# Patient Record
Sex: Male | Born: 1985 | Race: White | Hispanic: No | Smoking: Former smoker
Health system: Southern US, Community
[De-identification: ages and names within clinical notes are randomized; demographics above are authoritative.]

## PROBLEM LIST (undated history)

## (undated) DIAGNOSIS — G4733 Obstructive sleep apnea (adult) (pediatric): Secondary | ICD-10-CM

## (undated) DIAGNOSIS — N189 Chronic kidney disease, unspecified: Secondary | ICD-10-CM

## (undated) DIAGNOSIS — M51369 Other intervertebral disc degeneration, lumbar region without mention of lumbar back pain or lower extremity pain: Secondary | ICD-10-CM

## (undated) DIAGNOSIS — R519 Headache, unspecified: Secondary | ICD-10-CM

## (undated) DIAGNOSIS — K219 Gastro-esophageal reflux disease without esophagitis: Secondary | ICD-10-CM

## (undated) DIAGNOSIS — G8929 Other chronic pain: Secondary | ICD-10-CM

## (undated) DIAGNOSIS — J45909 Unspecified asthma, uncomplicated: Secondary | ICD-10-CM

## (undated) DIAGNOSIS — M722 Plantar fascial fibromatosis: Secondary | ICD-10-CM

## (undated) DIAGNOSIS — E559 Vitamin D deficiency, unspecified: Secondary | ICD-10-CM

## (undated) DIAGNOSIS — K769 Liver disease, unspecified: Secondary | ICD-10-CM

## (undated) DIAGNOSIS — M5136 Other intervertebral disc degeneration, lumbar region: Secondary | ICD-10-CM

## (undated) HISTORY — DX: Obstructive sleep apnea (adult) (pediatric): G47.33

## (undated) HISTORY — DX: Other intervertebral disc degeneration, lumbar region without mention of lumbar back pain or lower extremity pain: M51.369

## (undated) HISTORY — DX: Other chronic pain: G89.29

## (undated) HISTORY — DX: Chronic kidney disease, unspecified: N18.9

## (undated) HISTORY — DX: Plantar fascial fibromatosis: M72.2

## (undated) HISTORY — DX: Other intervertebral disc degeneration, lumbar region: M51.36

## (undated) HISTORY — DX: Headache, unspecified: R51.9

## (undated) HISTORY — DX: Gastro-esophageal reflux disease without esophagitis: K21.9

## (undated) HISTORY — DX: Vitamin D deficiency, unspecified: E55.9

## (undated) HISTORY — PX: NO PAST SURGERIES: SHX2092

## (undated) HISTORY — DX: Liver disease, unspecified: K76.9

---

## 2016-05-10 ENCOUNTER — Encounter (HOSPITAL_COMMUNITY): Payer: Self-pay | Admitting: Emergency Medicine

## 2016-05-10 ENCOUNTER — Emergency Department (HOSPITAL_COMMUNITY)
Admission: EM | Admit: 2016-05-10 | Discharge: 2016-05-10 | Disposition: A | Discharge status: Home / Self Care | Payer: Non-veteran care | Attending: Emergency Medicine | Admitting: Emergency Medicine

## 2016-05-10 ENCOUNTER — Emergency Department (HOSPITAL_COMMUNITY): Payer: Non-veteran care

## 2016-05-10 DIAGNOSIS — R05 Cough: Secondary | ICD-10-CM | POA: Diagnosis present

## 2016-05-10 DIAGNOSIS — R6889 Other general symptoms and signs: Secondary | ICD-10-CM

## 2016-05-10 DIAGNOSIS — J111 Influenza due to unidentified influenza virus with other respiratory manifestations: Secondary | ICD-10-CM | POA: Insufficient documentation

## 2016-05-10 LAB — CBC
HCT: 42.1 % (ref 39.0–52.0)
Hemoglobin: 14.5 g/dL (ref 13.0–17.0)
MCH: 31.4 pg (ref 26.0–34.0)
MCHC: 34.4 g/dL (ref 30.0–36.0)
MCV: 91.1 fL (ref 78.0–100.0)
PLATELETS: 140 10*3/uL — AB (ref 150–400)
RBC: 4.62 MIL/uL (ref 4.22–5.81)
RDW: 12.7 % (ref 11.5–15.5)
WBC: 6.5 10*3/uL (ref 4.0–10.5)

## 2016-05-10 LAB — I-STAT TROPONIN, ED: Troponin i, poc: 0 ng/mL (ref 0.00–0.08)

## 2016-05-10 LAB — BASIC METABOLIC PANEL
Anion gap: 9 (ref 5–15)
BUN: 9 mg/dL (ref 6–20)
CALCIUM: 8.7 mg/dL — AB (ref 8.9–10.3)
CO2: 28 mmol/L (ref 22–32)
CREATININE: 1.2 mg/dL (ref 0.61–1.24)
Chloride: 103 mmol/L (ref 101–111)
GFR calc Af Amer: >60 mL/min (ref >60)
Glucose, Bld: 121 mg/dL — ABNORMAL HIGH (ref 65–99)
Potassium: 4.2 mmol/L (ref 3.5–5.1)
SODIUM: 140 mmol/L (ref 135–145)

## 2016-05-10 MED ORDER — NAPROXEN 500 MG PO TABS: 500.0000 mg | ORAL_TABLET | Freq: Two times a day (BID) | ORAL | 0 refills | Status: DC

## 2016-05-10 MED ORDER — ACETAMINOPHEN 325 MG PO TABS
650.0000 mg | ORAL_TABLET | Freq: Once | ORAL | Status: AC
  Administered 2016-05-10: 650 mg via ORAL
  Filled 2016-05-10: qty 2

## 2016-05-10 MED ORDER — SODIUM CHLORIDE 0.9 % IV BOLUS (SEPSIS)
1000.0000 mL | Freq: Once | INTRAVENOUS | Status: AC
  Administered 2016-05-10: 1000 mL via INTRAVENOUS

## 2016-05-10 MED ORDER — SODIUM CHLORIDE 0.9 % IV SOLN: INTRAVENOUS | Status: DC

## 2016-05-10 MED ORDER — DM-GUAIFENESIN ER 30-600 MG PO TB12: 1.0000 | ORAL_TABLET | Freq: Two times a day (BID) | ORAL | 1 refills | Status: DC

## 2016-05-10 MED ORDER — SODIUM CHLORIDE 0.9 % IV BOLUS (SEPSIS)
1000.0000 mL | Freq: Once | INTRAVENOUS | Status: AC
Start: 2016-05-10 — End: 2016-05-10
  Administered 2016-05-10: 1000 mL via INTRAVENOUS

## 2016-05-10 NOTE — ED Triage Notes (Signed)
Pt reports cough with clear phlegm, fevers and chest pain since Friday. States son had pneumonia last week.

## 2016-05-10 NOTE — ED Notes (Signed)
Pt states he understands instructions. All questions answered home stable with steady gait.

## 2016-05-10 NOTE — Discharge Instructions (Signed)
Rest increase fluid intake. Work note provided. Take the Naprosyn as needed for headaches body ache or any fevers. Taking Mucinex DM for the cough. Return for any new or worse symptoms.

## 2016-05-10 NOTE — ED Provider Notes (Signed)
[facility]-EMERGENCY DEPT Provider Note   CSN: [id number] Arrival date & time: 05/10/16  [id number]     History   Chief Complaint Chief Complaint  Patient presents with  . Chest Pain  . Cough  . URI    HPI Michael Carter is a 30 y.o. male.  Patient with onset of cough productive fevers bodyaches headache some shortness of breath. Since Friday. Patient has a son at home with a diagnosis of pneumonia. No nausea vomiting or diarrhea.      History reviewed. No pertinent past medical history.  There are no active problems to display for this patient.   History reviewed. No pertinent surgical history.     Home Medications    Prior to Admission medications   Medication Sig Start Date End Date Taking? Authorizing Provider  acetaminophen (TYLENOL) 325 MG tablet Take 650 mg by mouth every 6 (six) hours as needed for fever.   Yes Historical Provider, MD  flunisolide (NASALIDE) 25 MCG/ACT (0.025%) SOLN Place 2 sprays into the nose daily as needed (allergies).   Yes Historical Provider, MD  loratadine (CLARITIN) 10 MG tablet Take 10 mg by mouth daily as needed for allergies.   Yes Historical Provider, MD  triprolidine-pseudoephedrine (APRODINE) 2.5-60 MG TABS tablet Take 1 tablet by mouth every 6 (six) hours as needed for allergies.   Yes Historical Provider, MD  dextromethorphan-guaiFENesin (MUCINEX DM) 30-600 MG 12hr tablet Take 1 tablet by mouth 2 (two) times daily. 05/10/16   Vanetta MuldersScott Eleah Lahaie, MD  naproxen (NAPROSYN) 500 MG tablet Take 1 tablet (500 mg total) by mouth 2 (two) times daily. 05/10/16   Vanetta MuldersScott Danyal Whitenack, MD    Family History No family history on file.  Social History Social History  Substance Use Topics  . Smoking status: Never Smoker  . Smokeless tobacco: Never Used  . Alcohol use No     Allergies   Percocet [oxycodone-acetaminophen]   Review of Systems Review of Systems  Constitutional: Positive for fever.  HENT: Positive for congestion. Negative for sore  throat.   Eyes: Negative for redness.  Respiratory: Positive for cough and shortness of breath.   Cardiovascular: Negative for chest pain.  Gastrointestinal: Negative for abdominal pain.  Musculoskeletal: Positive for myalgias.  Skin: Negative for rash.  Neurological: Positive for headaches.  Hematological: Does not bruise/bleed easily.  Psychiatric/Behavioral: Negative for confusion.     Physical Exam Updated Vital Signs BP 120/57   Pulse 88   Temp 100 F (37.8 C) (Oral)   Resp (!) 27   SpO2 96%   Physical Exam  Constitutional: He is oriented to person, place, and time. He appears well-developed and well-nourished. No distress.  HENT:  Head: Normocephalic and atraumatic.  Mucous membranes dry.  Eyes: EOM are normal. Pupils are equal, round, and reactive to light.  Neck: Normal range of motion. Neck supple.  Cardiovascular: Normal rate, regular rhythm and normal heart sounds.   No murmur heard. Pulmonary/Chest: Effort normal and breath sounds normal. No respiratory distress. He has no wheezes. He has no rales.  Abdominal: Soft. Bowel sounds are normal. There is no tenderness.  Musculoskeletal: Normal range of motion. He exhibits no edema.  Neurological: He is alert and oriented to person, place, and time. No cranial nerve deficit or sensory deficit. He exhibits normal muscle tone. Coordination normal.  Skin: Skin is warm. Capillary refill takes less than 2 seconds.  Nursing note and vitals reviewed.    ED Treatments / Results  Labs (all labs ordered are listed,  but only abnormal results are displayed) Labs Reviewed  BASIC METABOLIC PANEL - Abnormal; Notable for the following:       Result Value   Glucose, Bld 121 (*)    Calcium 8.7 (*)    All other components within normal limits  CBC - Abnormal; Notable for the following:    Platelets 140 (*)    All other components within normal limits  I-STAT TROPOININ, ED    EKG  EKG Interpretation  Date/Time:  Sunday  May 10 2016 07:00:03 EST Ventricular Rate:  113 PR Interval:    QRS Duration: 72 QT Interval:  303 QTC Calculation: 416 R Axis:   4 Text Interpretation:  Sinus tachycardia Probable anteroseptal infarct, old No prior for comparison No previous ECGs available Reconfirmed by Thandiwe Siragusa  MD, Latroya Ng [phone]([id number]) on 05/10/2016 7:27:31 AM Also confirmed by Deretha EmoryZACKOWSKI  MD, Ezrie Bunyan [phone]([id number]), editor [place]  CCT, BEVERLY ([id number])  on 05/10/2016 7:48:17 AM       Radiology Dg Chest 2 View  Result Date: 05/10/2016 CLINICAL DATA:  Patient with productive cough.  Fever and chills. EXAM: CHEST  2 VIEW COMPARISON:  None. FINDINGS: The heart size and mediastinal contours are within normal limits. Both lungs are clear. The visualized skeletal structures are unremarkable. IMPRESSION: No active cardiopulmonary disease. Electronically Signed   By: Annia Beltrew  Davis M.D.   On: 05/10/2016 07:56    Procedures Procedures (including critical care time)  Medications Ordered in ED Medications  0.9 %  sodium chloride infusion ( Intravenous Not Given 05/10/16 1128)  sodium chloride 0.9 % bolus 1,000 mL (0 mLs Intravenous Stopped 05/10/16 0951)  sodium chloride 0.9 % bolus 1,000 mL (0 mLs Intravenous Stopped 05/10/16 1053)  sodium chloride 0.9 % bolus 1,000 mL (0 mLs Intravenous Stopped 05/10/16 1250)     Initial Impression / Assessment and Plan / ED Course  I have reviewed the triage vital signs and the nursing notes.  Pertinent labs & imaging results that were available during my care of the patient were reviewed by me and considered in my medical decision making (see chart for details).  Clinical Course     Symptoms consistent with flulike illness. Chest x-ray negative for pneumonia. Patient with significant dehydration. Hydrated with 3 L of fluid before he started to urinate. Patient feels better overall. Will be treated symptomatically for cough and congestion and body aches and fever. Patient will return for any  new or worse symptoms. Work note provided.  Final Clinical Impressions(s) / ED Diagnoses   Final diagnoses:  Flu-like symptoms    New Prescriptions New Prescriptions   DEXTROMETHORPHAN-GUAIFENESIN (MUCINEX DM) 30-600 MG 12HR TABLET    Take 1 tablet by mouth 2 (two) times daily.   NAPROXEN (NAPROSYN) 500 MG TABLET    Take 1 tablet (500 mg total) by mouth 2 (two) times daily.     Vanetta MuldersScott Camdyn Laden, MD 05/10/16 [phone]

## 2017-03-21 ENCOUNTER — Emergency Department (HOSPITAL_COMMUNITY): Payer: BC Managed Care – PPO

## 2017-03-21 ENCOUNTER — Encounter (HOSPITAL_COMMUNITY): Payer: Self-pay | Admitting: Emergency Medicine

## 2017-03-21 ENCOUNTER — Emergency Department (HOSPITAL_COMMUNITY)
Admission: EM | Admit: 2017-03-21 | Discharge: 2017-03-21 | Disposition: A | Discharge status: Home / Self Care | Payer: BC Managed Care – PPO | Attending: Emergency Medicine | Admitting: Emergency Medicine

## 2017-03-21 DIAGNOSIS — R509 Fever, unspecified: Secondary | ICD-10-CM | POA: Diagnosis present

## 2017-03-21 DIAGNOSIS — B349 Viral infection, unspecified: Secondary | ICD-10-CM | POA: Diagnosis not present

## 2017-03-21 DIAGNOSIS — R51 Headache: Secondary | ICD-10-CM | POA: Insufficient documentation

## 2017-03-21 DIAGNOSIS — R519 Headache, unspecified: Secondary | ICD-10-CM

## 2017-03-21 DIAGNOSIS — R197 Diarrhea, unspecified: Secondary | ICD-10-CM

## 2017-03-21 DIAGNOSIS — R112 Nausea with vomiting, unspecified: Secondary | ICD-10-CM

## 2017-03-21 HISTORY — DX: Unspecified asthma, uncomplicated: J45.909

## 2017-03-21 LAB — MONONUCLEOSIS SCREEN: MONO SCREEN: NEGATIVE

## 2017-03-21 LAB — URINALYSIS, ROUTINE W REFLEX MICROSCOPIC
Bacteria, UA: NONE SEEN
Bilirubin Urine: NEGATIVE
Glucose, UA: NEGATIVE mg/dL
HGB URINE DIPSTICK: NEGATIVE
Ketones, ur: 20 mg/dL — AB
Leukocytes, UA: NEGATIVE
Nitrite: NEGATIVE
Protein, ur: 30 mg/dL — AB
SPECIFIC GRAVITY, URINE: 1.036 — AB (ref 1.005–1.030)
pH: 5 (ref 5.0–8.0)

## 2017-03-21 LAB — CBC
HEMATOCRIT: 43.1 % (ref 39.0–52.0)
HEMOGLOBIN: 14.7 g/dL (ref 13.0–17.0)
MCH: 31 pg (ref 26.0–34.0)
MCHC: 34.1 g/dL (ref 30.0–36.0)
MCV: 90.9 fL (ref 78.0–100.0)
Platelets: 130 10*3/uL — ABNORMAL LOW (ref 150–400)
RBC: 4.74 MIL/uL (ref 4.22–5.81)
RDW: 12.6 % (ref 11.5–15.5)
WBC: 10.8 10*3/uL — ABNORMAL HIGH (ref 4.0–10.5)

## 2017-03-21 LAB — INFLUENZA PANEL BY PCR (TYPE A & B)
Influenza A By PCR: NEGATIVE
Influenza B By PCR: NEGATIVE

## 2017-03-21 LAB — COMPREHENSIVE METABOLIC PANEL
ALBUMIN: 4 g/dL (ref 3.5–5.0)
ALK PHOS: 37 U/L — AB (ref 38–126)
ALT: 35 U/L (ref 17–63)
ANION GAP: 9 (ref 5–15)
AST: 26 U/L (ref 15–41)
BUN: 13 mg/dL (ref 6–20)
CALCIUM: 8.7 mg/dL — AB (ref 8.9–10.3)
CHLORIDE: 105 mmol/L (ref 101–111)
CO2: 23 mmol/L (ref 22–32)
Creatinine, Ser: 1.26 mg/dL — ABNORMAL HIGH (ref 0.61–1.24)
GFR calc Af Amer: >60 mL/min (ref >60)
GFR calc non Af Amer: >60 mL/min (ref >60)
GLUCOSE: 107 mg/dL — AB (ref 65–99)
POTASSIUM: 4.1 mmol/L (ref 3.5–5.1)
SODIUM: 137 mmol/L (ref 135–145)
Total Bilirubin: 1.2 mg/dL (ref 0.3–1.2)
Total Protein: 6.8 g/dL (ref 6.5–8.1)

## 2017-03-21 LAB — LIPASE, BLOOD: LIPASE: 25 U/L (ref 11–51)

## 2017-03-21 LAB — I-STAT CG4 LACTIC ACID, ED
LACTIC ACID, VENOUS: 2.56 mmol/L — AB (ref 0.5–1.9)
Lactic Acid, Venous: 0.58 mmol/L (ref 0.5–1.9)

## 2017-03-21 LAB — RAPID STREP SCREEN (MED CTR MEBANE ONLY): STREPTOCOCCUS, GROUP A SCREEN (DIRECT): NEGATIVE

## 2017-03-21 MED ORDER — ACETAMINOPHEN 325 MG PO TABS
650.0000 mg | ORAL_TABLET | Freq: Once | ORAL | Status: AC
  Administered 2017-03-21: 650 mg via ORAL
  Filled 2017-03-21: qty 2

## 2017-03-21 MED ORDER — ONDANSETRON HCL 4 MG/2ML IJ SOLN
4.0000 mg | Freq: Once | INTRAMUSCULAR | Status: AC
  Administered 2017-03-21: 4 mg via INTRAVENOUS
  Filled 2017-03-21: qty 2

## 2017-03-21 MED ORDER — IBUPROFEN 400 MG PO TABS
600.0000 mg | ORAL_TABLET | Freq: Once | ORAL | Status: AC
  Administered 2017-03-21: 18:00:00 600 mg via ORAL
  Filled 2017-03-21: qty 1

## 2017-03-21 MED ORDER — KETOROLAC TROMETHAMINE 30 MG/ML IJ SOLN
30.0000 mg | Freq: Once | INTRAMUSCULAR | Status: AC
  Administered 2017-03-21: 30 mg via INTRAVENOUS
  Filled 2017-03-21: qty 1

## 2017-03-21 MED ORDER — ONDANSETRON 8 MG PO TBDP: 8.0000 mg | ORAL_TABLET | Freq: Three times a day (TID) | ORAL | 0 refills | Status: DC | PRN

## 2017-03-21 MED ORDER — SODIUM CHLORIDE 0.9 % IV BOLUS (SEPSIS)
1000.0000 mL | Freq: Once | INTRAVENOUS | Status: AC
  Administered 2017-03-21: 1000 mL via INTRAVENOUS

## 2017-03-21 MED ORDER — IOPAMIDOL (ISOVUE-300) INJECTION 61%
INTRAVENOUS | Status: AC
  Administered 2017-03-21: 100 mL
  Filled 2017-03-21: qty 100

## 2017-03-21 NOTE — ED Triage Notes (Signed)
Pt reports sore throat, fever TMAX 103.3, headache, stomach pain and diarrhea x3 days. Tylenol effective for fever, last dose 0330.

## 2017-03-21 NOTE — ED Provider Notes (Signed)
5:28 PM Patient signed out to me at shift change. Patient to the emergency department complaining of generalized body aches, headache, nausea, vomiting, diarrhea. Patient's blood work initially showed slightly elevated lactic acid of 2.56. His temperature was 102. He was treated with Toradol, Tylenol, IV fluids. His white blood cell count was okay at 10.8. His creatinine was slightly elevated at 1.26, otherwise normal electrolytes. His influenza panel was negative, strep screen, mononucleosis screen were all negative. CT scan abdomen and pelvis was obtained which was normal as well other than maybe mild cholelithiasis. I reassessed the patient at this time. He still complaining of a headache and sore throat. His temperature which I checked myself is normal at 98.7. His vital signs are normal. I examined his abdomen, it is nontender. Specifically no right upper quadrant tenderness. He has no nuchal rigidity. Lungs are clear. He has no photophobia. Had do not think this patient has meningitis, I do not think he has cholecystitis. His repeat lactic acid came back normal at 0.58. He is asking for something else for the headache, will give him ibuprofen. He is also asking for something for nausea at home. I discharge him I believe patient is stable for discharge home, most likely viral gastroenteritis given multiple episodes of nausea, vomiting, diarrhea at home and fever. He has not had an episode of diarrhea here. He is comfortable going home. We discussed return precautions, also discussed following up with family doctor. He agreed and voiced understanding of all discharge instructions.  Vitals:   03/21/17 1330 03/21/17 1415 03/21/17 1500 03/21/17 1545  BP: 108/63 114/67 108/72 (!) 105/58  Pulse: 92 95 87 88  Resp: Temp:   98.8 F (37.1 C)   TempSrc:   Oral   SpO2: 97% 98% 95% 95%  Weight:      Height:          Jaynie Crumble, PA-C 03/21/17 1743    Shaune Pollack, MD 03/21/17  1935

## 2017-03-21 NOTE — ED Provider Notes (Signed)
[facility]Y DEPT Provider Note   CSN: [id number] Arrival date & time: 03/21/17  4098     History   Chief Complaint Chief Complaint  Patient presents with  . Fever  . Abdominal Pain    HPI   Blood pressure 119/75, pulse 83, temperature 98.6 F (37 C), temperature source Oral, resp. rate 18, height  (1.702 m), weight 122.5 kg (270 lb), SpO2 95 %.  Michael Carter is a 31 y.o. male with past medical history significant for nonalcoholic fatty liver disease complaining of fever for 3 days (Tmax 103.3 this a.m.), appropriately defervesces this with Tylenol, sore throat,several episodes of nonbloody, nonbilious, no coffee-ground emesis this morning with associated dry cough, headache, myalgia including abdominal pain and posterior thoracic pain with neck pain. 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 He denies any tick bites, rashes.    History reviewed. No pertinent past medical history.  There are no active problems to display for this patient.   History reviewed. No pertinent surgical history.     Home Medications    Prior to Admission medications   Medication Sig Start Date End Date Taking? Authorizing Provider  acetaminophen (TYLENOL) 500 MG tablet Take 1,000 mg by mouth every 6 (six) hours as needed for moderate pain, fever or headache.    Yes [provider]  cetirizine (ZYRTEC) 10 MG tablet Take 10 mg by mouth daily as needed for allergies.   Yes [provider]  fluticasone (FLONASE) 50 MCG/ACT nasal spray Place 2 sprays into both nostrils daily.   Yes [provider]  dextromethorphan-guaiFENesin (MUCINEX DM) 30-600 MG 12hr tablet Take 1 tablet by mouth 2 (two) times daily. Patient not taking: Reported on 03/21/2017 05/10/16   Vanetta Mulders, MD  naproxen (NAPROSYN) 500 MG tablet Take 1 tablet (500 mg total) by mouth 2 (two) times daily. Patient not taking: Reported on 03/21/2017  05/10/16   Vanetta Mulders, MD    Family History No family history on file.  Social History Social History  Substance Use Topics  . Smoking status: Never Smoker  . Smokeless tobacco: Never Used  . Alcohol use No     Allergies   Percocet [oxycodone-acetaminophen]   Review of Systems Review of Systems  A complete review of systems was obtained and all systems are negative except as noted in the HPI and PMH.    Physical Exam Updated Vital Signs BP (!) 105/58   Pulse 88   Temp 98.8 F (37.1 C) (Oral)   Resp 20   Ht  (1.702 m)   Wt 122.5 kg (270 lb)   SpO2 95%   BMI 42.29 kg/m   Physical Exam  Constitutional: He appears well-developed and well-nourished.  HENT:  Head: Normocephalic.  Right Ear: External ear normal.  Left Ear: External ear normal.  Mouth/Throat: Oropharynx is clear and moist. No oropharyngeal exudate.  No drooling or stridor. Posterior pharynx mildly erythematous no significant tonsillar hypertrophy. No exudate. Soft palate rises symmetrically. No TTP or induration under tongue.   No tenderness to palpation of frontal or bilateral maxillary sinuses.  Mild mucosal edema in the nares with scant rhinorrhea.  Bilateral tympanic membranes with normal architecture and good light reflex.    Eyes: Pupils are equal, round, and reactive to light. Conjunctivae and EOM are normal.  Neck: Normal range of motion. Neck supple.  FROM to C-spine. Pt can touch chin to chest without discomfort. No TTP of  midline cervical spine.   Cardiovascular: Normal rate and regular rhythm.   Pulmonary/Chest: Effort normal and breath sounds normal. No stridor. No respiratory distress. He has no wheezes. He has no rales. He exhibits no tenderness.  Abdominal: Soft. There is no tenderness. There is no rebound and no guarding.  Neurological: Abnormal muscle tone: .npmdm.  Nursing note and vitals reviewed.    ED Treatments / Results  Labs (all labs ordered are listed,  but only abnormal results are displayed) Labs Reviewed  COMPREHENSIVE METABOLIC PANEL - Abnormal; Notable for the following:       Result Value   Glucose, Bld 107 (*)    Creatinine, Ser 1.26 (*)    Calcium 8.7 (*)    Alkaline Phosphatase 37 (*)    All other components within normal limits  CBC - Abnormal; Notable for the following:    WBC 10.8 (*)    Platelets 130 (*)    All other components within normal limits  URINALYSIS, ROUTINE W REFLEX MICROSCOPIC - Abnormal; Notable for the following:    Specific Gravity, Urine 1.036 (*)    Ketones, ur 20 (*)    Protein, ur 30 (*)    Squamous Epithelial / LPF 0-5 (*)    All other components within normal limits  I-STAT CG4 LACTIC ACID, ED - Abnormal; Notable for the following:    Lactic Acid, Venous 2.56 (*)    All other components within normal limits  RAPID STREP SCREEN (NOT AT [facility])  CULTURE, GROUP A STREP ([facility])  CULTURE, BLOOD (ROUTINE X 2)  CULTURE, BLOOD (ROUTINE X 2)  LIPASE, BLOOD  INFLUENZA PANEL BY PCR (TYPE A & B)  MONONUCLEOSIS SCREEN  I-STAT CG4 LACTIC ACID, ED    EKG  EKG Interpretation None       Radiology Dg Chest 2 View  Result Date: 03/21/2017 CLINICAL DATA:  Migraines.  Cough and fever. EXAM: CHEST  2 VIEW COMPARISON:  May 10, 2016 FINDINGS: The heart size and mediastinal contours are within normal limits. Both lungs are clear. The visualized skeletal structures are unremarkable. IMPRESSION: No active cardiopulmonary disease. Electronically Signed   By: Gerome Sam III M.D   On: 03/21/2017 11:51    Procedures Procedures (including critical care time)  Medications Ordered in ED Medications  sodium chloride 0.9 % bolus 1,000 mL (0 mLs Intravenous Stopped 03/21/17 1517)  ondansetron (ZOFRAN) injection 4 mg (4 mg Intravenous Given 03/21/17 1233)  ketorolac (TORADOL) 30 MG/ML injection 30 mg (30 mg Intravenous Given 03/21/17 1234)  sodium chloride 0.9 % bolus 1,000 mL (0 mLs Intravenous Stopped 03/21/17  1517)  acetaminophen (TYLENOL) tablet 650 mg (650 mg Oral Given 03/21/17 1355)     Initial Impression / Assessment and Plan / ED Course  I have reviewed the triage vital signs and the nursing notes.  Pertinent labs & imaging results that were available during my care of the patient were reviewed by me and considered in my medical decision making (see chart for details).     Vitals:   03/21/17 1330 03/21/17 1415 03/21/17 1500 03/21/17 1545  BP: 108/63 114/67 108/72 (!) 105/58  Pulse: 92 95 87 88  Resp: Temp:   98.8 F (37.1 C)   TempSrc:   Oral   SpO2: 97% 98% 95% 95%  Weight:      Height:        Medications  sodium chloride 0.9 % bolus 1,000 mL (0 mLs Intravenous Stopped 03/21/17 1517)  ondansetron [facility]) injection 4 mg (4 mg Intravenous Given 03/21/17 1233)  ketorolac (TORADOL) 30 MG/ML injection 30 mg (30 mg Intravenous Given 03/21/17 1234)  sodium chloride 0.9 % bolus 1,000 mL (0 mLs Intravenous Stopped 03/21/17 1517)  acetaminophen (TYLENOL) tablet 650 mg (650 mg Oral Given 03/21/17 1355)    Michael Carter is 31 y.o. male presenting with Fever for 3 days, emesis, myalgia, sore throat.. Patient nontoxic appearing, likely influenza or parainfluenza virus. Abdominal exam is benign.  Rapid strep negative, flu, mono pending. Temperature spiked to 102.8. Patient given Toradol.Lactic acid very mildly elevated at 2.5, will give 2 L and reassess. Given his emesis and think it's reasonable to obtain a CT, he is reporting abdominal pain but is also reporting diffuse myalgia as well. Repeat abdominal exam is nonsurgical.  PA lactic acid is normalized at 0.58.  Case signed out to PA Kirinchenko at shift change: Plan is to follow CT, likely discharge to home with return precautions for influenza-like illness.  Final Clinical Impressions(s) / ED Diagnoses   Final diagnoses:  None    New Prescriptions New Prescriptions   No medications on file     Kaylyn Lim 03/21/17 1600    Loren Racer, MD 03/28/17 2330

## 2017-03-21 NOTE — ED Notes (Signed)
Patient transported to CT 

## 2017-03-21 NOTE — Discharge Instructions (Signed)
Return to the emergency room for any worsening or concerning symptoms including fast breathing, heart racing, confusion, vomiting. ° °Rest, cover your mouth when you cough and wash your hands frequently.  ° °Push fluids: water or Gatorade, do not drink any soda, juice or caffeinated beverages. ° °For fever and pain control you can take Motrin (ibuprofen) as follows: 400 mg (this is normally 2 over the counter pills) every 4 hours with food. ° °Do not return to work until a day after your fever breaks.  ° ° ° °

## 2017-03-21 NOTE — ED Notes (Signed)
Patient transported to X-ray 

## 2017-03-23 LAB — CULTURE, GROUP A STREP (THRC)

## 2017-03-24 ENCOUNTER — Encounter (HOSPITAL_COMMUNITY): Payer: Self-pay | Admitting: Emergency Medicine

## 2017-03-24 ENCOUNTER — Emergency Department (HOSPITAL_COMMUNITY)
Admission: EM | Admit: 2017-03-24 | Discharge: 2017-03-24 | Disposition: A | Discharge status: Home / Self Care | Payer: BC Managed Care – PPO | Attending: Emergency Medicine | Admitting: Emergency Medicine

## 2017-03-24 DIAGNOSIS — Z79899 Other long term (current) drug therapy: Secondary | ICD-10-CM | POA: Insufficient documentation

## 2017-03-24 DIAGNOSIS — J029 Acute pharyngitis, unspecified: Secondary | ICD-10-CM | POA: Diagnosis not present

## 2017-03-24 DIAGNOSIS — J45909 Unspecified asthma, uncomplicated: Secondary | ICD-10-CM | POA: Insufficient documentation

## 2017-03-24 DIAGNOSIS — R112 Nausea with vomiting, unspecified: Secondary | ICD-10-CM | POA: Insufficient documentation

## 2017-03-24 DIAGNOSIS — R590 Localized enlarged lymph nodes: Secondary | ICD-10-CM | POA: Diagnosis not present

## 2017-03-24 DIAGNOSIS — R509 Fever, unspecified: Secondary | ICD-10-CM | POA: Insufficient documentation

## 2017-03-24 DIAGNOSIS — H9209 Otalgia, unspecified ear: Secondary | ICD-10-CM | POA: Insufficient documentation

## 2017-03-24 DIAGNOSIS — R197 Diarrhea, unspecified: Secondary | ICD-10-CM | POA: Insufficient documentation

## 2017-03-24 LAB — CBC WITH DIFFERENTIAL/PLATELET
BASOS PCT: 0 %
Basophils Absolute: 0 10*3/uL (ref 0.0–0.1)
EOS ABS: 0.1 10*3/uL (ref 0.0–0.7)
Eosinophils Relative: 1 %
HEMATOCRIT: 41.1 % (ref 39.0–52.0)
HEMOGLOBIN: 13.5 g/dL (ref 13.0–17.0)
Lymphocytes Relative: 18 %
Lymphs Abs: 1.2 10*3/uL (ref 0.7–4.0)
MCH: 29.9 pg (ref 26.0–34.0)
MCHC: 32.8 g/dL (ref 30.0–36.0)
MCV: 91.1 fL (ref 78.0–100.0)
MONOS PCT: 9 %
Monocytes Absolute: 0.6 10*3/uL (ref 0.1–1.0)
NEUTROS ABS: 4.8 10*3/uL (ref 1.7–7.7)
NEUTROS PCT: 72 %
Platelets: 156 10*3/uL (ref 150–400)
RBC: 4.51 MIL/uL (ref 4.22–5.81)
RDW: 12.8 % (ref 11.5–15.5)
WBC: 6.6 10*3/uL (ref 4.0–10.5)

## 2017-03-24 LAB — URINALYSIS, ROUTINE W REFLEX MICROSCOPIC
BACTERIA UA: NONE SEEN
BILIRUBIN URINE: NEGATIVE
Glucose, UA: NEGATIVE mg/dL
Hgb urine dipstick: NEGATIVE
Ketones, ur: 5 mg/dL — AB
LEUKOCYTES UA: NEGATIVE
Nitrite: NEGATIVE
Protein, ur: 100 mg/dL — AB
SPECIFIC GRAVITY, URINE: 1.027 (ref 1.005–1.030)
SQUAMOUS EPITHELIAL / LPF: NONE SEEN
pH: 6 (ref 5.0–8.0)

## 2017-03-24 LAB — COMPREHENSIVE METABOLIC PANEL
ALK PHOS: 34 U/L — AB (ref 38–126)
ALT: 34 U/L (ref 17–63)
ANION GAP: 8 (ref 5–15)
AST: 29 U/L (ref 15–41)
Albumin: 3.4 g/dL — ABNORMAL LOW (ref 3.5–5.0)
BUN: 5 mg/dL — ABNORMAL LOW (ref 6–20)
CALCIUM: 8.5 mg/dL — AB (ref 8.9–10.3)
CHLORIDE: 100 mmol/L — AB (ref 101–111)
CO2: 28 mmol/L (ref 22–32)
CREATININE: 1.05 mg/dL (ref 0.61–1.24)
GFR calc non Af Amer: >60 mL/min (ref >60)
Glucose, Bld: 108 mg/dL — ABNORMAL HIGH (ref 65–99)
Potassium: 3.8 mmol/L (ref 3.5–5.1)
SODIUM: 136 mmol/L (ref 135–145)
Total Bilirubin: 0.6 mg/dL (ref 0.3–1.2)
Total Protein: 7.1 g/dL (ref 6.5–8.1)

## 2017-03-24 LAB — I-STAT CG4 LACTIC ACID, ED: LACTIC ACID, VENOUS: 1.08 mmol/L (ref 0.5–1.9)

## 2017-03-24 MED ORDER — DEXAMETHASONE SODIUM PHOSPHATE 10 MG/ML IJ SOLN
10.0000 mg | Freq: Once | INTRAMUSCULAR | Status: AC
  Administered 2017-03-24: 10 mg via INTRAVENOUS
  Filled 2017-03-24: qty 1

## 2017-03-24 MED ORDER — KETOROLAC TROMETHAMINE 30 MG/ML IJ SOLN
30.0000 mg | Freq: Once | INTRAMUSCULAR | Status: AC
  Administered 2017-03-24: 30 mg via INTRAVENOUS
  Filled 2017-03-24: qty 1

## 2017-03-24 MED ORDER — SODIUM CHLORIDE 0.9 % IV BOLUS (SEPSIS)
2000.0000 mL | Freq: Once | INTRAVENOUS | Status: AC
  Administered 2017-03-24: 2000 mL via INTRAVENOUS

## 2017-03-24 MED ORDER — ACETAMINOPHEN 325 MG PO TABS
650.0000 mg | ORAL_TABLET | Freq: Once | ORAL | Status: AC | PRN
  Administered 2017-03-24: 650 mg via ORAL

## 2017-03-24 MED ORDER — ACETAMINOPHEN 325 MG PO TABS
ORAL_TABLET | ORAL | Status: AC
  Filled 2017-03-24: qty 2

## 2017-03-24 NOTE — Discharge Instructions (Signed)
Please read and follow all provided instructions.  Your diagnoses today include:  1. Pharyngitis, unspecified etiology     Tests performed today include:  Blood counts and electrolytes - normal  Urine test - normal  Vital signs. See below for your results today.   Medications prescribed:   None  Home care instructions:  Please read the educational materials provided and follow any instructions contained in this packet. Continue medications prescribed previous visits.  Follow-up instructions: Please follow-up with your primary care provider as needed for further evaluation of your symptoms.  Return instructions:   Please return to the Emergency Department if you experience worsening symptoms.   Return if you have worsening problems swallowing, your neck becomes swollen, you cannot swallow your saliva or your voice becomes muffled.   Return with high persistent fever, persistent vomiting, or if you have trouble breathing.   Please return if you have any other emergent concerns.  Additional Information:  Your vital signs today were: BP 126/82    Pulse 78    Temp 100 F (37.8 C) (Oral)    Resp 18    SpO2 93%  If your blood pressure (BP) was elevated above 135/85 this visit, please have this repeated by your doctor within one month. --------------

## 2017-03-24 NOTE — ED Triage Notes (Addendum)
Pt to ED c/o worsening sore throat and possible dehydration. Pt reports he was seen here over the weekend and then at [facility] - given amoxicillin and has been taking OTC medications ibuprofen and cold/cough med. Pt reports fevers have continued, took all meds this morning and reports feeling worse. He states nausea is the only thing better today, but his throat and neck have become more swollen. Pt talking in clear, full sentences, airway intact. Also reports new diarrhea starting today. States his tests for strep and flu were both negative.

## 2017-03-24 NOTE — ED Provider Notes (Signed)
[facility]-EMERGENCY DEPT Provider Note   CSN: [id number] Arrival date & time: 03/24/17  1056     History   Chief Complaint Chief Complaint  Patient presents with  . Sore Throat  . Fever    HPI Michael Carter is a 31 y.o. male.  Patient presents with continued severe sore throat. Patient seen in emergency department on 9/22 for sore throat and abdominal pain. He had a negative abdominal CT, negative tests for influenza, mononucleosis, strep test. Patient was treated with anti-inflammatories. He followed up at urgent care and was started on amoxicillin. Symptoms have continued. Patient has fevers, occasional diarrheawhich seems to be improving. He has had 2 and half days of amoxicillin. He is able to swallow but with difficulty due to the throat pain. He has had some ear pain and fullness. Occasional episodes of nausea and vomiting. No urinary symptoms. Pt is sexually active with one partner. Oral and vaginal sex. He does not feel at risk for STI. No symptoms of urethritis.       Past Medical History:  Diagnosis Date  . Asthma     There are no active problems to display for this patient.   History reviewed. No pertinent surgical history.     Home Medications    Prior to Admission medications   Medication Sig Start Date End Date Taking? Authorizing Provider  acetaminophen (TYLENOL) 500 MG tablet Take 1,000 mg by mouth every 6 (six) hours as needed for moderate pain, fever or headache.     [provider]  cetirizine (ZYRTEC) 10 MG tablet Take 10 mg by mouth daily as needed for allergies.    [provider]  dextromethorphan-guaiFENesin (MUCINEX DM) 30-600 MG 12hr tablet Take 1 tablet by mouth 2 (two) times daily. Patient not taking: Reported on 03/21/2017 05/10/16   Vanetta Mulders, MD  fluticasone [facility]) 50 MCG/ACT nasal spray Place 2 sprays into both nostrils daily.    [provider]  naproxen (NAPROSYN) 500 MG tablet Take 1 tablet (500 mg total)  by mouth 2 (two) times daily. Patient not taking: Reported on 03/21/2017 05/10/16   Vanetta Mulders, MD  ondansetron (ZOFRAN ODT) 8 MG disintegrating tablet Take 1 tablet (8 mg total) by mouth every 8 (eight) hours as needed for nausea or vomiting. 03/21/17   Jaynie Crumble, PA-C    Family History No family history on file.  Social History Social History  Substance Use Topics  . Smoking status: Never Smoker  . Smokeless tobacco: Never Used  . Alcohol use No     Allergies   Percocet [oxycodone-acetaminophen]   Review of Systems Review of Systems  Constitutional: Negative for chills, fatigue and fever.  HENT: Positive for ear pain, sore throat and trouble swallowing. Negative for congestion, rhinorrhea and sinus pressure.   Eyes: Negative for redness.  Respiratory: Negative for cough and wheezing.   Gastrointestinal: Positive for diarrhea, nausea and vomiting. Negative for abdominal pain.  Genitourinary: Negative for discharge and dysuria.  Musculoskeletal: Negative for myalgias and neck stiffness.  Skin: Negative for rash.  Neurological: Negative for headaches.  Hematological: Negative for adenopathy.     Physical Exam Updated Vital Signs BP 114/76   Pulse 72   Temp 100 F (37.8 C) (Oral)   Resp 16   SpO2 95%   Physical Exam  Constitutional: He appears well-developed and well-nourished.  HENT:  Head: Normocephalic and atraumatic.  Right Ear: Tympanic membrane, external ear and ear canal normal.  Left Ear: Tympanic membrane, external ear and  ear canal normal.  Nose: Nose normal. No mucosal edema or rhinorrhea.  Mouth/Throat: Uvula is midline and mucous membranes are normal. Mucous membranes are not dry. No trismus in the jaw. No uvula swelling. Posterior oropharyngeal edema and posterior oropharyngeal erythema present. No tonsillar abscesses. Tonsils are 4+ on the right. Tonsils are 4+ on the left. Tonsillar exudate.  Eyes: Conjunctivae are normal. Right eye  exhibits no discharge. Left eye exhibits no discharge.  Neck: Normal range of motion. Neck supple.  Cardiovascular: Normal rate, regular rhythm and normal heart sounds.   Pulmonary/Chest: Effort normal and breath sounds normal. No respiratory distress. He has no wheezes. He has no rales.  Abdominal: Soft. There is no tenderness.  Lymphadenopathy:    He has cervical adenopathy.  Neurological: He is alert.  Skin: Skin is warm and dry.  Psychiatric: He has a normal mood and affect.  Nursing note and vitals reviewed.    ED Treatments / Results  Labs (all labs ordered are listed, but only abnormal results are displayed) Labs Reviewed  COMPREHENSIVE METABOLIC PANEL - Abnormal; Notable for the following:       Result Value   Chloride 100 (*)    Glucose, Bld 108 (*)    BUN 5 (*)    Calcium 8.5 (*)    Albumin 3.4 (*)    Alkaline Phosphatase 34 (*)    All other components within normal limits  URINALYSIS, ROUTINE W REFLEX MICROSCOPIC - Abnormal; Notable for the following:    Color, Urine AMBER (*)    Ketones, ur 5 (*)    Protein, ur 100 (*)    All other components within normal limits  CBC WITH DIFFERENTIAL/PLATELET  I-STAT CG4 LACTIC ACID, ED    EKG  EKG Interpretation None       Radiology No results found.  Procedures Procedures (including critical care time)  Medications Ordered in ED Medications  acetaminophen (TYLENOL) 325 MG tablet (not administered)  acetaminophen (TYLENOL) tablet 650 mg (650 mg Oral Given 03/24/17 1142)  sodium chloride 0.9 % bolus 2,000 mL (2,000 mLs Intravenous New Bag/Given 03/24/17 1541)  ketorolac (TORADOL) 30 MG/ML injection 30 mg (30 mg Intravenous Given 03/24/17 1541)  dexamethasone (DECADRON) injection 10 mg (10 mg Intravenous Given 03/24/17 1541)     Initial Impression / Assessment and Plan / ED Course  I have reviewed the triage vital signs and the nursing notes.  Pertinent labs & imaging results that were available during my care of  the patient were reviewed by me and considered in my medical decision making (see chart for details).     Patient seen and examined. Work-up from previous ED visit reviewed. Medications ordered.   Vital signs reviewed and are as follows: BP 114/76   Pulse 72   Temp 100 F (37.8 C) (Oral)   Resp 16   SpO2 95%   5:44 PM patient rechecked. He is eating applesauce and drinking ginger ale in the room. He states that he is feeling a bit better. He has received 2 L of IV normal saline. Will discharge to home at this time. He will continue to take the amoxicillin prescribed previously. Encouraged return to the emergency department with inability to swallow, difficulty breathing, high persistent fever, vomiting, new concerns. Patient verbalizes understanding and agrees with plan.  Final Clinical Impressions(s) / ED Diagnoses   Final diagnoses:  Pharyngitis, unspecified etiology   Patient with 5-6 days of pharyngitis. He has had workup including mononucleosis test, strep test, influenza test  which were negative. Based on exam, it appears patient was started on amoxicillin. Patient treated supportively today in emergency department with improvement in subjective symptoms. He is eating and drinking well here. Pain is controlled. He is tolerating his secretions. No signs of peritonsillar abscess on exam. No trismus or decreased range of motion in neck, high fever, elevated white count suspect the space infection in neck. Overall, history low concern for gonococcal pharyngitis, however he is sexually active. Given constellation of other symptoms, have low suspicion for this however if symptoms persisted and did not get better, I would consider testing. At this point, patient appears well, ready for discharge home.  New Prescriptions New Prescriptions   No medications on file     Renne Crigler, Cordelia Poche 03/24/17 1747    Tegeler, Canary Brim, MD 03/24/17 2029

## 2017-03-26 LAB — CULTURE, BLOOD (ROUTINE X 2)
Culture: NO GROWTH
Culture: NO GROWTH
SPECIAL REQUESTS: ADEQUATE
SPECIAL REQUESTS: ADEQUATE

## 2018-11-15 IMAGING — CT CT ABD-PELV W/ CM
2 of 5 series · 16 of 46 positions shown, 18 images · IV contrast (APPLIED)
Comparison: None.

CLINICAL DATA: Right upper quadrant pain with nausea and vomiting
being today. Suspect gastroenteritis or colitis.

EXAM:
CT ABDOMEN AND PELVIS WITH CONTRAST
TECHNIQUE: Multidetector CT imaging of the abdomen and pelvis was performed
using the standard protocol following bolus administration of
intravenous contrast.
CONTRAST:  < 100 mL BUGA1P-JTT IOPAMIDOL (BUGA1P-JTT) INJECTION 61%

[Series 3: abd/ pelvis 5.0 i30f 2 · axial · 0.96mm/px · z∈[+675,+1125]mm · 13 of 102 slices shown, 15 images]
[im 6/102  soft-tissue]
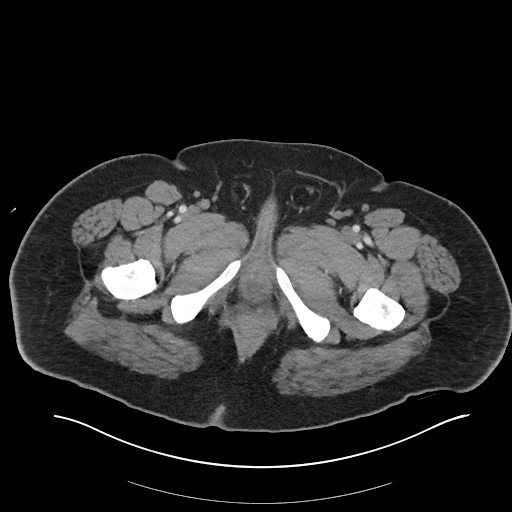
[im 6/102  bone]
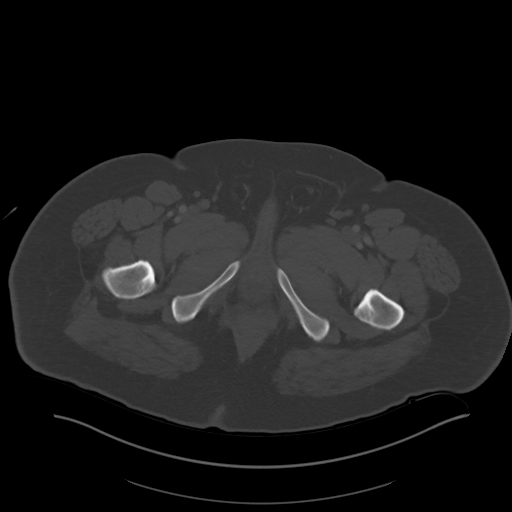
[im 12/102  soft-tissue]
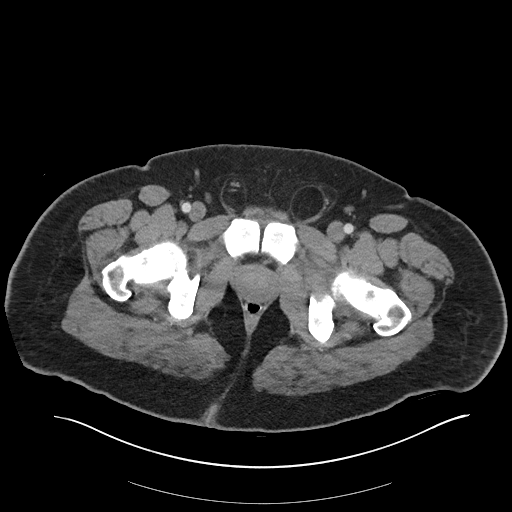
[im 23/102  soft-tissue]
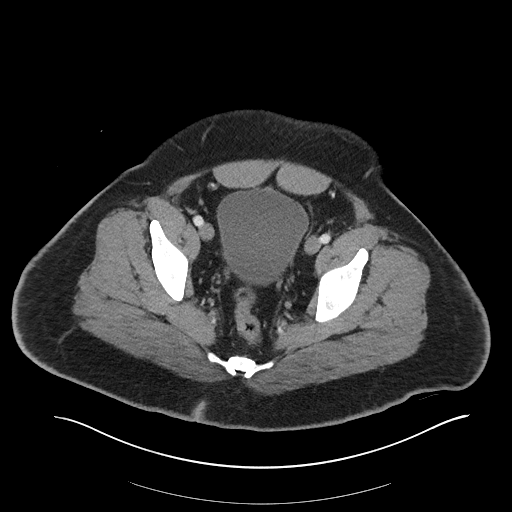
[im 29/102  soft-tissue]
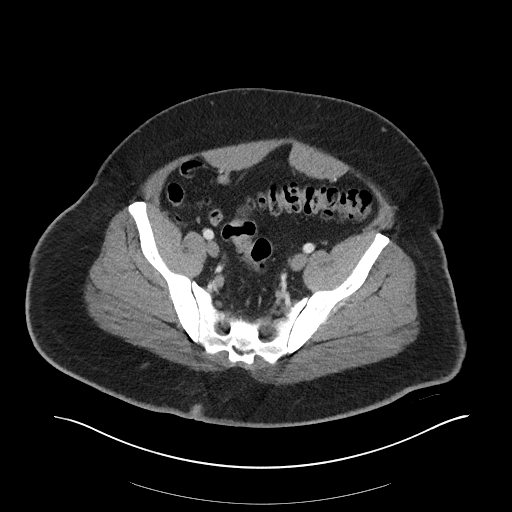
[im 34/102  soft-tissue]
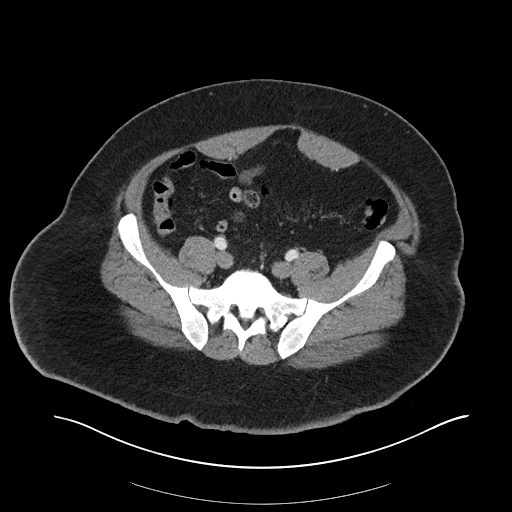
[im 45/102  soft-tissue]
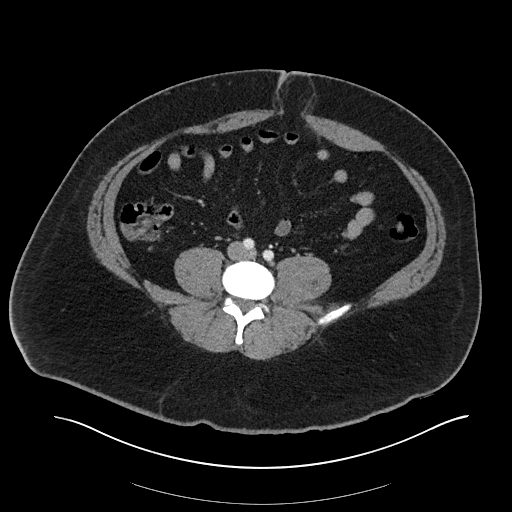
[im 51/102  soft-tissue]
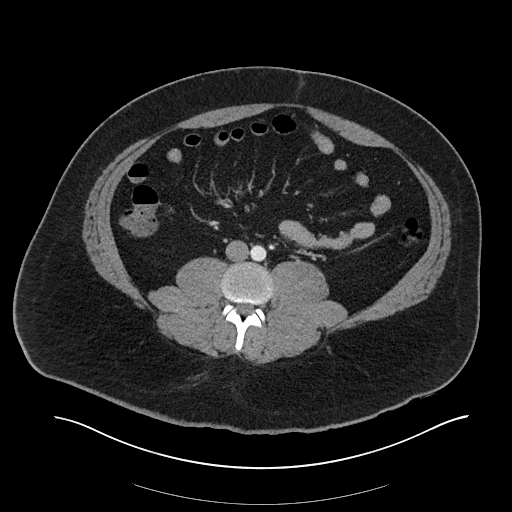
[im 57/102  soft-tissue]
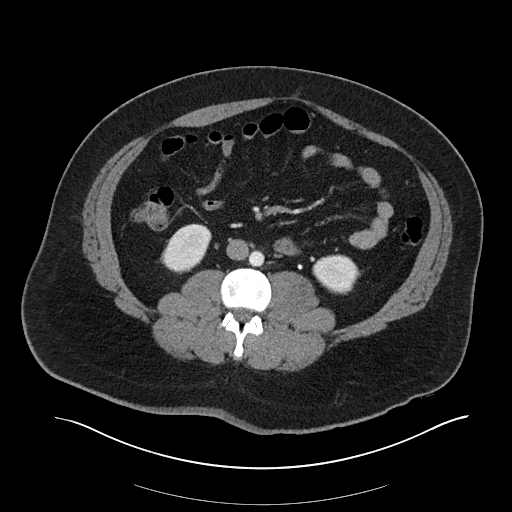
[im 68/102  soft-tissue]
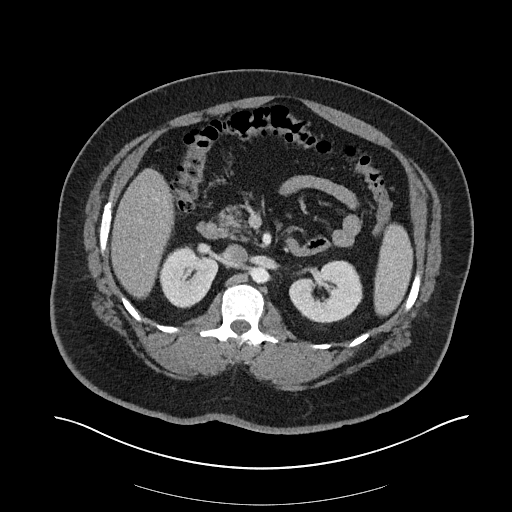
[im 68/102  bone]
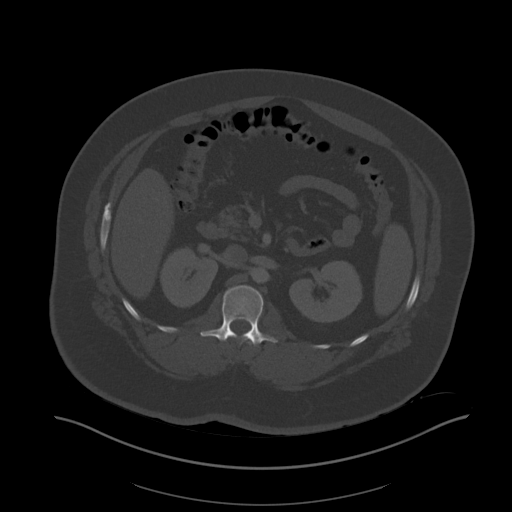
[im 73/102  soft-tissue]
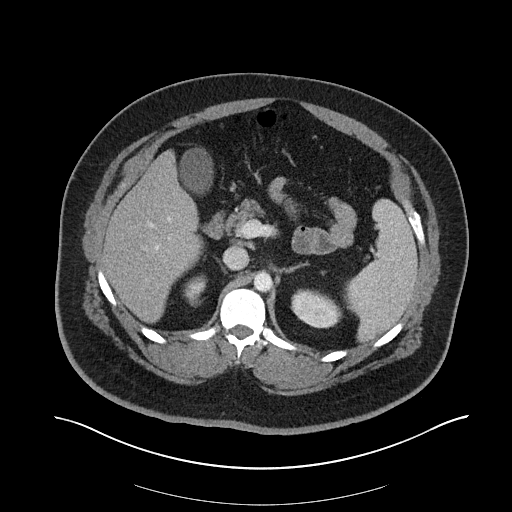
[im 79/102  soft-tissue]
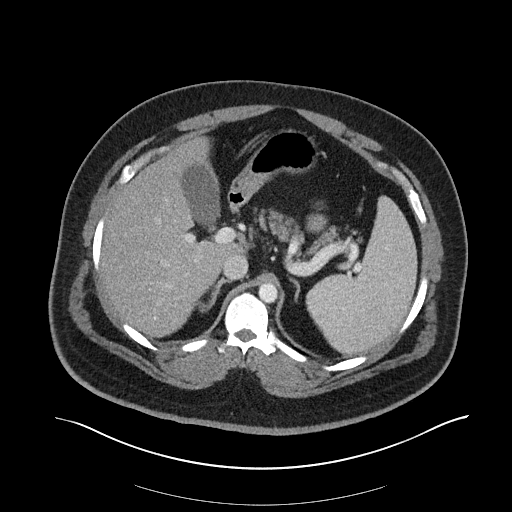
[im 90/102  soft-tissue]
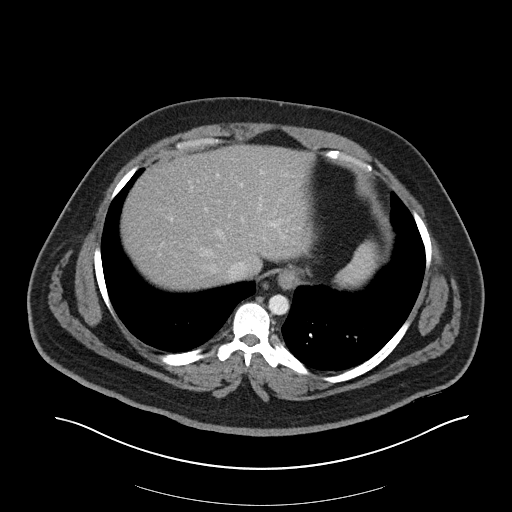
[im 96/102  soft-tissue]
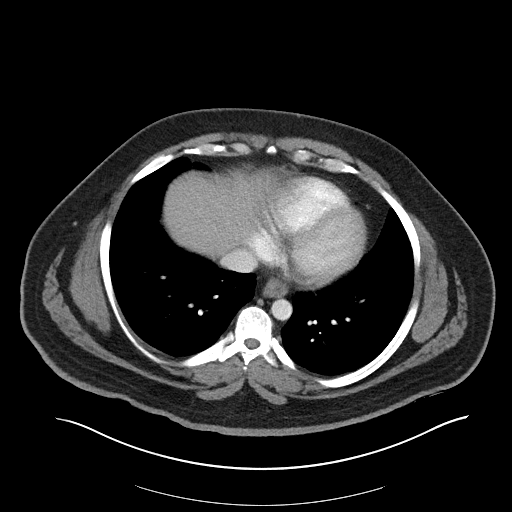

[Series 6: coronal soft tissue · coronal · 0.78mm/px · 3 of 114 slices shown]
[im 38/114  soft-tissue]
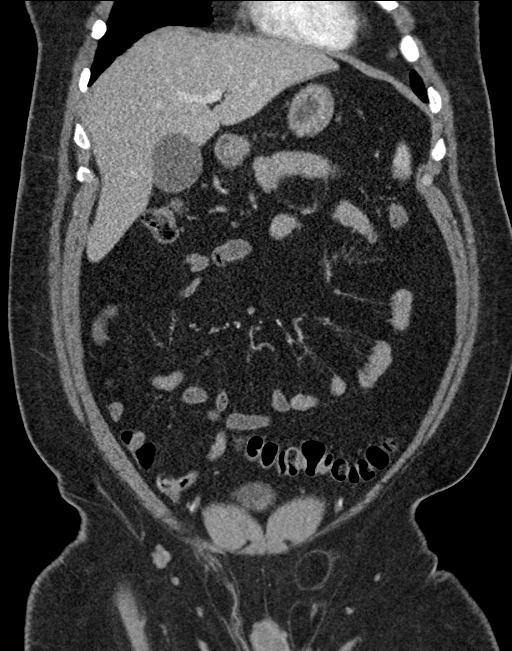
[im 51/114  soft-tissue]
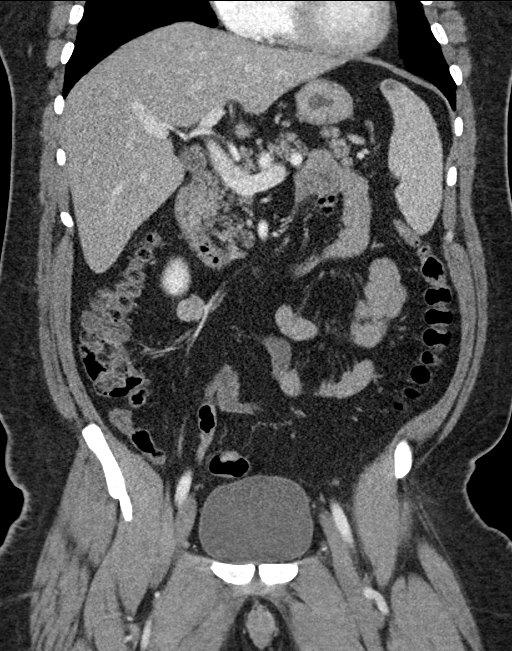
[im 63/114  soft-tissue]
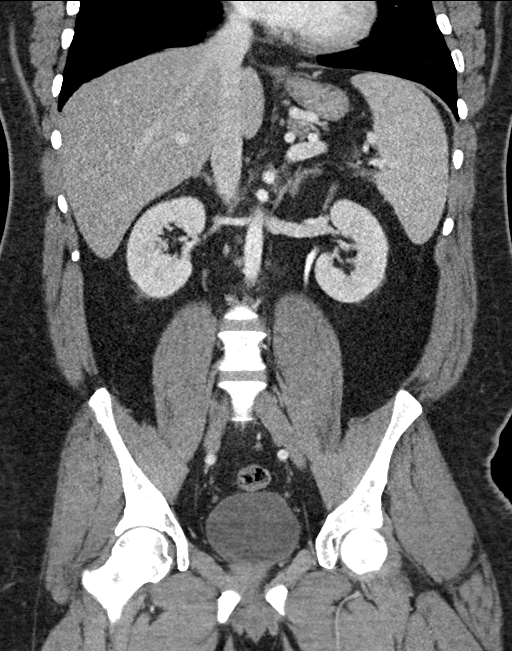

[16 of 46 positions shown; findings below may reference images not displayed]

FINDINGS: Lower chest: No acute abnormality.

Hepatobiliary: Liver and biliary tree are normal. Possible minimal
cholelithiasis.

Pancreas: Within normal.

Spleen: Within normal.

Adrenals/Urinary Tract: Left adrenal gland is normal. There is a
cm nodule over the lateral limb of the right adrenal gland likely an
adenoma. Kidneys are normal in size without hydronephrosis or
nephrolithiasis. Ureters and bladder are normal.

Stomach/Bowel: Stomach and small bowel are normal. Appendix is
normal. Colon is normal.

Vascular/Lymphatic: Vascular structures are within normal. Few small
subcentimeter lymph nodes adjacent distal esophagus and
gastroesophageal junction/ gastrohepatic ligament and celiac axis.

Reproductive: Within normal.

Other: Small left paraumbilical hernia containing only peritoneal
fat. Small left inguinal hernia containing only peritoneal fat.

Musculoskeletal: Within normal.
IMPRESSION: No acute findings in the abdomen/pelvis.

Possible subtle cholelithiasis.

1.3 cm right adrenal nodule likely an adenoma.

Small left her umbilical hernia containing only peritoneal fat.
Small left inguinal hernia containing only peritoneal fat.

## 2020-03-28 ENCOUNTER — Encounter: Payer: Self-pay | Admitting: *Deleted

## 2020-03-29 ENCOUNTER — Encounter: Payer: Self-pay | Admitting: Neurology

## 2020-03-29 ENCOUNTER — Ambulatory Visit (INDEPENDENT_AMBULATORY_CARE_PROVIDER_SITE_OTHER): Payer: BC Managed Care – PPO | Admitting: Neurology

## 2020-03-29 ENCOUNTER — Other Ambulatory Visit: Payer: Self-pay

## 2020-03-29 VITALS — BP 133/81 | HR 74 | Ht 68.0 in | Wt 338.0 lb

## 2020-03-29 DIAGNOSIS — G43009 Migraine without aura, not intractable, without status migrainosus: Secondary | ICD-10-CM

## 2020-03-29 MED ORDER — RIZATRIPTAN BENZOATE 10 MG PO TBDP: 10.0000 mg | ORAL_TABLET | ORAL | 11 refills | Status: DC | PRN

## 2020-03-29 MED ORDER — ONDANSETRON 4 MG PO TBDP: 4.0000 mg | ORAL_TABLET | Freq: Three times a day (TID) | ORAL | 3 refills | Status: DC | PRN

## 2020-03-29 NOTE — Progress Notes (Signed)
[facility]    Provider:  Dr Lucia Gaskins Requesting Provider: Knox Royalty, MD Primary Care Provider:  Knox Royalty, MD  CC:  headaches  HPI:  Michael Carter is a 34 y.o. male here as requested by Knox Royalty, MD for headaches. PMHx OSA, liver disease, headache, DDD, CKD, chronic pain. He has had them for years. Worse in July and starts in the left eye, throbbing,nausea,vomiting,photophobia. Having them once a week on average. They last 4-6 hours. His vision gets blurry but then will come back fine, not new, he has had 3 TBIs and maybe related to that, hasn't changed in years, this year he wakes with headaches, he has a cpap and he is using it but he loses his mask he tries but he hates, he has gained weight, headaches not positional or exertional, No flashing lights, no aura, no numbness, tingling, weakness. Butalbital helps. Lights make it worse. He doesn't know what triggers them. It slowly comes on and intensifies. He takes a pill but he may still progress. 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 No other focal neurologic deficits, associated symptoms, inciting events or modifiable factors.  Reviewed notes, labs and imaging from outside physicians, which showed:  I reviewed Dr. Yetta Barre notes: Patient described worsening headaches, sudden in onset, at the time he was seen there last he was describing daily headaches for 4 hours, left frontal area, left temporal area, right frontal area in the right temporal area.  Symptoms included nausea, vomiting and photophobia, treating her with aspirin, poor symptom control, patient states headaches began when he started taking dicyclomine in July 2021 but since stopping dicyclomine still has headaches, he also discussed the rashes followed by new medication use doxycycline and prednisone, also knee pain and vitamin D deficiency.  For his headache he was  started on butalbital every 4 hours without a refill.  Also diagnosed with prediabetes, I reviewed labs which showed a recent urine drug screen which was negative, CBC in September 2018 was normal, CMP had some decreased chloride, elevated glucose, really unremarkable despite some values being out of range.  Review of Systems: Patient complains of symptoms per HPI as well as the following symptoms headaches, TBI. Pertinent negatives and positives per HPI. All others negative.   Social History   Socioeconomic History  . Marital status: Married    Spouse name: Not on file  . Number of children: Not on file  . Years of education: Not on file  . Highest education level: Not on file  Occupational History  . Not on file  Tobacco Use  . Smoking status: Former Smoker    Years: 5.00  . Smokeless tobacco: Never Used  . Tobacco comment: quit age 39  Substance and Sexual Activity  . Alcohol use: No  . Drug use: Not Currently  . Sexual activity: Not on file  Other Topics Concern  . Not on file  Social History Narrative  . Not on file   Social Determinants of Health   Financial Resource Strain:   . Difficulty of Paying Living Expenses: Not on file  Food Insecurity:   . Worried About Programme researcher, broadcasting/film/video in the Last Year: Not on file  . Ran Out of Food in the Last Year: Not on file  Transportation Needs:   . Lack of Transportation (Medical): Not on file  . Lack of Transportation (Non-Medical): Not on file  Physical Activity:   . Days of Exercise per Week: Not on file  . Minutes of Exercise per Session: Not on file  Stress:   . Feeling of Stress : Not on file  Social Connections:   . Frequency of Communication with Friends and Family: Not on file  . Frequency of Social Gatherings with Friends and Family: Not on file  . Attends Religious Services: Not on file  . Active Member of Clubs or Organizations: Not on file  . Attends BankerClub or Organization Meetings: Not on file  . Marital  Status: Not on file  Intimate Partner Violence:   . Fear of Current or Ex-Partner: Not on file  . Emotionally Abused: Not on file  . Physically Abused: Not on file  . Sexually Abused: Not on file    Family History  Problem Relation Age of Onset  . Breast cancer Sister   . Ovarian cancer Sister   . Hypertension Paternal Grandmother   . Diabetes Paternal Grandfather     Past Medical History:  Diagnosis Date  . Acid reflux   . Asthma   . Chronic pain   . CKD (chronic kidney disease)   . DDD (degenerative disc disease), lumbar   . Headache   . Liver disease   . OSA (obstructive sleep apnea)   . Plantar fasciitis   . Vitamin D deficiency     Patient Active Problem List   Diagnosis Date Noted  . Migraine without aura and without status migrainosus, not intractable 04/01/2020    Past Surgical History:  Procedure Laterality Date  . NO PAST SURGERIES      Current Outpatient Medications  Medication Sig Dispense Refill  . butalbital-aspirin-caffeine (FIORINAL) 50-325-40 MG capsule Take 1 capsule by mouth every 4 (four) hours as needed.    . cetirizine (ZYRTEC) 10 MG tablet Take 10 mg by mouth daily as needed for allergies.    . fluticasone (FLONASE) 50 MCG/ACT nasal spray Place 2 sprays into both nostrils daily.    Marland Kitchen. omeprazole (PRILOSEC) 40 MG capsule Take 40 mg by mouth daily.    . ondansetron (ZOFRAN ODT) 8 MG disintegrating tablet Take 1 tablet (8 mg total) by mouth every 8 (eight) hours as needed for nausea or vomiting. 10 tablet 0  . Vitamin D, Ergocalciferol, (DRISDOL) 1.25 MG (50000 UNIT) CAPS capsule Take by mouth.    . ondansetron (ZOFRAN-ODT) 4 MG disintegrating tablet Take 1-2 tablets (4-8 mg total) by mouth every 8 (eight) hours as needed for nausea. Take for migraines. May take with Rizatriptan. 30 tablet 3  . rizatriptan (MAXALT-MLT) 10 MG disintegrating tablet Take 1 tablet (10 mg total) by mouth as needed for migraine. May repeat in 2 hours if needed 9 tablet 11    No current facility-administered medications for this visit.    Allergies as of 03/29/2020 - Review Complete 03/29/2020  Allergen Reaction Noted  . Doxycycline  03/29/2020  . Percocet [oxycodone-acetaminophen] Other (See Comments) 05/10/2016    Vitals: BP 133/81   Pulse 74   Ht 5\' 8"  (1.727 m)   Wt (!) 338 lb (153.3 kg)   BMI 51.39 kg/m  Last Weight:  Wt Readings from Last 1 Encounters:  03/29/20 (!) 338 lb (153.3 kg)   Last Height:   Ht Readings from Last 1 Encounters:  03/29/20 5\' 8"  (1.727 m)     Physical exam: Exam: Gen: NAD, conversant, well nourised, obese, well groomed  CV: RRR, no MRG. No Carotid Bruits. No peripheral edema, warm, nontender Eyes: Conjunctivae clear without exudates or hemorrhage  Neuro: Detailed Neurologic Exam  Speech:    Speech is normal; fluent and spontaneous with normal comprehension.  Cognition:    The patient is oriented to person, place, and time;     recent and remote memory intact;     language fluent;     normal attention, concentration,     fund of knowledge Cranial Nerves:    The pupils are equal, round, and reactive to light. The fundi are flat. Visual fields are full to finger confrontation. Extraocular movements are intact. Trigeminal sensation is intact and the muscles of mastication are normal. The face is symmetric. The palate elevates in the midline. Hearing intact. Voice is normal. Shoulder shrug is normal. The tongue has normal motion without fasciculations.   Coordination:    No dysmetria or ataxia Gait:    Normal native gait  Motor Observation:    No asymmetry, no atrophy, and no involuntary movements noted. Tone:    Normal muscle tone.    Posture:    Posture is normal. normal erect    Strength:    Strength is V/V in the upper and lower limbs.      Sensation: intact to LT     Reflex Exam:  DTR's:    Deep tendon reflexes in the upper and lower extremities are normal bilaterally.    Toes:    The toes are downgoing bilaterally.   Clonus:    Clonus is absent.    Assessment/Plan:  We discussed prevention and acute management. He gets the migraines once a week and they have improved since the summer. I provided some literature on preventative medications. Gave him info on the [facility]. No brain imaging indicated at this time.   At onset of migraine take Rizatriptan: Please take one tablet at the onset of your headache. If it does not improve the symptoms please take one additional tablet. Do not take more then 2 tablets in 24hrs. Do not take use more then 2 to 3 times in a week. Ondansetron: Take that with the first dose of Rizatriptan If this doesn't work consider starting on Topiramate preventatively  If this doesn't work consider starting on Topiramate  Discussed: To prevent or relieve headaches, try the following: Cool Compress. Lie down and place a cool compress on your head.  Avoid headache triggers. If certain foods or odors seem to have triggered your migraines in the past, avoid them. A headache diary might help you identify triggers.  Include physical activity in your daily routine. Try a daily walk or other moderate aerobic exercise.  Manage stress. Find healthy ways to cope with the stressors, such as delegating tasks on your to-do list.  Practice relaxation techniques. Try deep breathing, yoga, massage and visualization.  Eat regularly. Eating regularly scheduled meals and maintaining a healthy diet might help prevent headaches. Also, drink plenty of fluids.  Follow a regular sleep schedule. Sleep deprivation might contribute to headaches Consider biofeedback. With this mind-body technique, you learn to control certain bodily functions -- such as muscle tension, heart rate and blood pressure -- to prevent headaches or reduce headache pain.    Proceed to emergency room if you experience new or worsening symptoms or symptoms do not  resolve, if you have new neurologic symptoms or if headache is severe, or for any concerning symptom.   Provided education and documentation from [employer]  headache Society toolbox including articles on: chronic migraine medication overuse headache, chronic migraines, prevention of migraines, behavioral and other nonpharmacologic treatments for headache.   No orders of the defined types were placed in this encounter.  Meds ordered this encounter  Medications  . rizatriptan (MAXALT-MLT) 10 MG disintegrating tablet    Sig: Take 1 tablet (10 mg total) by mouth as needed for migraine. May repeat in 2 hours if needed    Dispense:  9 tablet    Refill:  11  . ondansetron (ZOFRAN-ODT) 4 MG disintegrating tablet    Sig: Take 1-2 tablets (4-8 mg total) by mouth every 8 (eight) hours as needed for nausea. Take for migraines. May take with Rizatriptan.    Dispense:  30 tablet    Refill:  3    Cc: Knox Royalty, MD,  Knox Royalty, MD  Naomie Dean, MD  [facility] [address] [place], [place] [address]  Phone [phone] Fax [phone]

## 2020-03-29 NOTE — Patient Instructions (Addendum)
At onset of migraine take Rizatriptan: Please take one tablet at the onset of your headache. If it does not improve the symptoms please take one additional tablet. Do not take more then 2 tablets in 24hrs. Do not take use more then 2 to 3 times in a week. Ondansetron: Take that with the first dose of Rizatriptan If this doesn't work consider starting on Topiramate preventatively  Ondansetron oral dissolving tablet What is this medicine? ONDANSETRON (on DAN se tron) is used to treat nausea and vomiting caused by chemotherapy. It is also used to prevent or treat nausea and vomiting after surgery. This medicine may be used for other purposes; ask your health care provider or pharmacist if you have questions. COMMON BRAND NAME(S): Zofran ODT What should I tell my health care provider before I take this medicine? They need to know if you have any of these conditions:  heart disease  history of irregular heartbeat  liver disease  low levels of magnesium or potassium in the blood  an unusual or allergic reaction to ondansetron, granisetron, other medicines, foods, dyes, or preservatives  pregnant or trying to get pregnant  breast-feeding How should I use this medicine? These tablets are made to dissolve in the mouth. Do not try to push the tablet through the foil backing. With dry hands, peel away the foil backing and gently remove the tablet. Place the tablet in the mouth and allow it to dissolve, then swallow. While you may take these tablets with water, it is not necessary to do so. Talk to your pediatrician regarding the use of this medicine in children. Special care may be needed. Overdosage: If you think you have taken too much of this medicine contact a poison control center or emergency room at once. NOTE: This medicine is only for you. Do not share this medicine with others. What if I miss a dose? If you miss a dose, take it as soon as you can. If it is almost time for your next  dose, take only that dose. Do not take double or extra doses. What may interact with this medicine? Do not take this medicine with any of the following medications:  apomorphine  certain medicines for fungal infections like fluconazole, itraconazole, ketoconazole, posaconazole, voriconazole  cisapride  dronedarone  pimozide  thioridazine This medicine may also interact with the following medications:  carbamazepine  certain medicines for depression, anxiety, or psychotic disturbances  fentanyl  linezolid  MAOIs like Carbex, Eldepryl, Marplan, Nardil, and Parnate  methylene blue (injected into a vein)  other medicines that prolong the QT interval (cause an abnormal heart rhythm) like dofetilide, ziprasidone  phenytoin  rifampicin  tramadol This list may not describe all possible interactions. Give your health care provider a list of all the medicines, herbs, non-prescription drugs, or dietary supplements you use. Also tell them if you smoke, drink alcohol, or use illegal drugs. Some items may interact with your medicine. What should I watch for while using this medicine? Check with your doctor or health care professional as soon as you can if you have any sign of an allergic reaction. What side effects may I notice from receiving this medicine? Side effects that you should report to your doctor or health care professional as soon as possible:  allergic reactions like skin rash, itching or hives, swelling of the face, lips, or tongue  breathing problems  confusion  dizziness  fast or irregular heartbeat  feeling faint or lightheaded, falls  fever and chills  loss of balance or coordination  seizures  sweating  swelling of the hands and feet  tightness in the chest  tremors  unusually weak or tired Side effects that usually do not require medical attention (report to your doctor or health care professional if they continue or are  bothersome):  constipation or diarrhea  headache This list may not describe all possible side effects. Call your doctor for medical advice about side effects. You may report side effects to FDA at 1-800-FDA-1088. Where should I keep my medicine? Keep out of the reach of children. Store between 2 and 30 degrees C (36 and 86 degrees F). Throw away any unused medicine after the expiration date. NOTE: This sheet is a summary. It may not cover all possible information. If you have questions about this medicine, talk to your doctor, pharmacist, or health care provider.  2020 [employer]/Gold Standard (2018-06-07 07:14:10) Rizatriptan disintegrating tablets What is this medicine? RIZATRIPTAN (rye za TRIP tan) is used to treat migraines with or without aura. An aura is a strange feeling or visual disturbance that warns you of an attack. It is not used to prevent migraines. This medicine may be used for other purposes; ask your health care provider or pharmacist if you have questions. COMMON BRAND NAME(S): Maxalt-MLT What should I tell my health care provider before I take this medicine? They need to know if you have any of these conditions:  cigarette smoker  circulation problems in fingers and toes  diabetes  heart disease  high blood pressure  high cholesterol  history of irregular heartbeat  history of stroke  kidney disease  liver disease  stomach or intestine problems  an unusual or allergic reaction to rizatriptan, other medicines, foods, dyes, or preservatives  pregnant or trying to get pregnant  breast-feeding How should I use this medicine? Take this medicine by mouth. Follow the directions on the prescription label. Leave the tablet in the sealed blister pack until you are ready to take it. With dry hands, open the blister and gently remove the tablet. If the tablet breaks or crumbles, throw it away and take a new tablet out of the blister pack. Place the tablet in the  mouth and allow it to dissolve, and then swallow. Do not cut, crush, or chew this medicine. You do not need water to take this medicine. Do not take it more often than directed. Talk to your pediatrician regarding the use of this medicine in children. While this drug may be prescribed for children as young as 6 years for selected conditions, precautions do apply. Overdosage: If you think you have taken too much of this medicine contact a poison control center or emergency room at once. NOTE: This medicine is only for you. Do not share this medicine with others. What if I miss a dose? This does not apply. This medicine is not for regular use. What may interact with this medicine? Do not take this medicine with any of the following medicines:  certain medicines for migraine headache like almotriptan, eletriptan, frovatriptan, naratriptan, rizatriptan, sumatriptan, zolmitriptan  ergot alkaloids like dihydroergotamine, ergonovine, ergotamine, methylergonovine  MAOIs like Carbex, Eldepryl, Marplan, Nardil, and Parnate This medicine may also interact with the following medications:  certain medicines for depression, anxiety, or psychotic disorders  propranolol This list may not describe all possible interactions. Give your health care provider a list of all the medicines, herbs, non-prescription drugs, or dietary supplements you use. Also tell them if you smoke, drink alcohol, or  use illegal drugs. Some items may interact with your medicine. What should I watch for while using this medicine? Visit your healthcare professional for regular checks on your progress. Tell your healthcare professional if your symptoms do not start to get better or if they get worse. You may get drowsy or dizzy. Do not drive, use machinery, or do anything that needs mental alertness until you know how this medicine affects you. Do not stand up or sit up quickly, especially if you are an older patient. This reduces the risk  of dizzy or fainting spells. Alcohol may interfere with the effect of this medicine. Your mouth may get dry. Chewing sugarless gum or sucking hard candy and drinking plenty of water may help. Contact your healthcare professional if the problem does not go away or is severe. If you take migraine medicines for 10 or more days a month, your migraines may get worse. Keep a diary of headache days and medicine use. Contact your healthcare professional if your migraine attacks occur more frequently. What side effects may I notice from receiving this medicine? Side effects that you should report to your doctor or health care professional as soon as possible:  allergic reactions like skin rash, itching or hives, swelling of the face, lips, or tongue  chest pain or chest tightness  signs and symptoms of a dangerous change in heartbeat or heart rhythm like chest pain; dizziness; fast, irregular heartbeat; palpitations; feeling faint or lightheaded; falls; breathing problems  signs and symptoms of a stroke like changes in vision; confusion; trouble speaking or understanding; severe headaches; sudden numbness or weakness of the face, arm or leg; trouble walking; dizziness; loss of balance or coordination  signs and symptoms of serotonin syndrome like irritable; confusion; diarrhea; fast or irregular heartbeat; muscle twitching; stiff muscles; trouble walking; sweating; high fever; seizures; chills; vomiting Side effects that usually do not require medical attention (report to your doctor or health care professional if they continue or are bothersome):  diarrhea  dizziness  drowsiness  dry mouth  headache  nausea, vomiting  pain, tingling, numbness in the hands or feet  stomach pain This list may not describe all possible side effects. Call your doctor for medical advice about side effects. You may report side effects to FDA at 1-800-FDA-1088. Where should I keep my medicine? Keep out of the  reach of children. Store at room temperature between 15 and 30 degrees C (59 and 86 degrees F). Protect from light and moisture. Throw away any unused medicine after the expiration date. NOTE: This sheet is a summary. It may not cover all possible information. If you have questions about this medicine, talk to your doctor, pharmacist, or health care provider.  2020 [employer]/Gold Standard (2017-12-28 14:58:08)  Topiramate tablets What is this medicine? TOPIRAMATE (toe PYRE a mate) is used to treat seizures in adults or children with epilepsy. It is also used for the prevention of migraine headaches. This medicine may be used for other purposes; ask your health care provider or pharmacist if you have questions. COMMON BRAND NAME(S): Topamax, Topiragen What should I tell my health care provider before I take this medicine? They need to know if you have any of these conditions:  bleeding disorders  kidney disease  lung or breathing disease, like asthma  suicidal thoughts, plans, or attempt; a previous suicide attempt by you or a family member  an unusual or allergic reaction to topiramate, other medicines, foods, dyes, or preservatives  pregnant or trying to  get pregnant  breast-feeding How should I use this medicine? Take this medicine by mouth with a glass of water. Follow the directions on the prescription label. Do not cut, crush or chew this medicine. Swallow the tablets whole. You can take it with or without food. If it upsets your stomach, take it with food. Take your medicine at regular intervals. Do not take it more often than directed. Do not stop taking except on your doctor's advice. A special MedGuide will be given to you by the pharmacist with each prescription and refill. Be sure to read this information carefully each time. Talk to your pediatrician regarding the use of this medicine in children. While this drug may be prescribed for children as young as 58 years of age for  selected conditions, precautions do apply. Overdosage: If you think you have taken too much of this medicine contact a poison control center or emergency room at once. NOTE: This medicine is only for you. Do not share this medicine with others. What if I miss a dose? If you miss a dose, take it as soon as you can. If your next dose is to be taken in less than 6 hours, then do not take the missed dose. Take the next dose at your regular time. Do not take double or extra doses. What may interact with this medicine? This medicine may interact with the following medications:  acetazolamide  alcohol  antihistamines for allergy, cough, and cold  aspirin and aspirin-like medicines  atropine  birth control pills  certain medicines for anxiety or sleep  certain medicines for bladder problems like oxybutynin, tolterodine  certain medicines for depression like amitriptyline, fluoxetine, sertraline  certain medicines for seizures like carbamazepine, phenobarbital, phenytoin, primidone, valproic acid, zonisamide  certain medicines for stomach problems like dicyclomine, hyoscyamine  certain medicines for travel sickness like scopolamine  certain medicines for Parkinson's disease like benztropine, trihexyphenidyl  certain medicines that treat or prevent blood clots like warfarin, enoxaparin, dalteparin, apixaban, dabigatran, and rivaroxaban  digoxin  general anesthetics like halothane, isoflurane, methoxyflurane, propofol  hydrochlorothiazide  ipratropium  lithium  medicines that relax muscles for surgery  metformin  narcotic medicines for pain  NSAIDs, medicines for pain and inflammation, like ibuprofen or naproxen  phenothiazines like chlorpromazine, mesoridazine, prochlorperazine, thioridazine  pioglitazone This list may not describe all possible interactions. Give your health care provider a list of all the medicines, herbs, non-prescription drugs, or dietary supplements  you use. Also tell them if you smoke, drink alcohol, or use illegal drugs. Some items may interact with your medicine. What should I watch for while using this medicine? Visit your doctor or health care professional for regular checks on your progress. Tell your health care professional if your symptoms do not start to get better or if they get worse. Do not stop taking except on your health care professional's advice. You may develop a severe reaction. Your health care professional will tell you how much medicine to take. Wear a medical ID bracelet or chain. Carry a card that describes your disease and details of your medicine and dosage times. This medicine can reduce the response of your body to heat or cold. Dress warm in cold weather and stay hydrated in hot weather. If possible, avoid extreme temperatures like saunas, hot tubs, very hot or cold showers, or activities that can cause dehydration such as vigorous exercise. Check with your health care professional if you have severe diarrhea, nausea, and vomiting, or if you sweat a  lot. The loss of too much body fluid may make it dangerous for you to take this medicine. You may get drowsy or dizzy. Do not drive, use machinery, or do anything that needs mental alertness until you know how this medicine affects you. Do not stand up or sit up quickly, especially if you are an older patient. This reduces the risk of dizzy or fainting spells. Alcohol may interfere with the effect of this medicine. Avoid alcoholic drinks. Tell your health care professional right away if you have any change in your eyesight. Patients and their families should watch out for new or worsening depression or thoughts of suicide. Also watch out for sudden changes in feelings such as feeling anxious, agitated, panicky, irritable, hostile, aggressive, impulsive, severely restless, overly excited and hyperactive, or not being able to sleep. If this happens, especially at the beginning of  treatment or after a change in dose, call your healthcare professional. This medicine may cause serious skin reactions. They can happen weeks to months after starting the medicine. Contact your health care provider right away if you notice fevers or flu-like symptoms with a rash. The rash may be red or purple and then turn into blisters or peeling of the skin. Or, you might notice a red rash with swelling of the face, lips or lymph nodes in your neck or under your arms. Birth control may not work properly while you are taking this medicine. Talk to your health care professional about using an extra method of birth control. Women should inform their health care professional if they wish to become pregnant or think they might be pregnant. There is a potential for serious side effects and harm to an unborn child. Talk to your health care professional for more information. What side effects may I notice from receiving this medicine? Side effects that you should report to your doctor or health care professional as soon as possible:  allergic reactions like skin rash, itching or hives, swelling of the face, lips, or tongue  blood in the urine  changes in vision  confusion  loss of memory  pain in lower back or side  pain when urinating  redness, blistering, peeling or loosening of the skin, including inside the mouth  signs and symptoms of bleeding such as bloody or black, tarry stools; red or dark brown urine; spitting up blood or brown material that looks like coffee grounds; red spots on the skin; unusual bruising or bleeding from the eyes, gums, or nose  signs and symptoms of increased acid in the body like breathing fast; fast heartbeat; headache; confusion; unusually weak or tired; nausea, vomiting  suicidal thoughts, mood changes  trouble speaking or understanding  unusual sweating  unusually weak or tired Side effects that usually do not require medical attention (report to your  doctor or health care professional if they continue or are bothersome):  dizziness  drowsiness  fever  loss of appetite  nausea, vomiting  pain, tingling, numbness in the hands or feet  stomach pain  tiredness  upset stomach This list may not describe all possible side effects. Call your doctor for medical advice about side effects. You may report side effects to FDA at 1-800-FDA-1088. Where should I keep my medicine? Keep out of the reach of children. Store at room temperature between 15 and 30 degrees C (59 and 86 degrees F). Throw away any unused medicine after the expiration date. NOTE: This sheet is a summary. It may not cover all possible  information. If you have questions about this medicine, talk to your doctor, pharmacist, or health care provider.  2020 [employer]/Gold Standard (2019-01-12 15:07:20)

## 2020-04-01 ENCOUNTER — Encounter: Payer: Self-pay | Admitting: Neurology

## 2020-04-01 DIAGNOSIS — G43009 Migraine without aura, not intractable, without status migrainosus: Secondary | ICD-10-CM | POA: Insufficient documentation

## 2020-04-01 NOTE — Addendum Note (Signed)
Addended by: Naomie Dean B on: 04/01/2020 12:52 PM   Modules accepted: Level of Service

## 2020-07-23 ENCOUNTER — Telehealth: Payer: No Typology Code available for payment source | Admitting: Adult Health

## 2020-08-06 ENCOUNTER — Ambulatory Visit: Payer: No Typology Code available for payment source | Admitting: Adult Health

## 2020-08-06 ENCOUNTER — Encounter: Payer: Self-pay | Admitting: Adult Health

## 2020-10-15 ENCOUNTER — Telehealth (INDEPENDENT_AMBULATORY_CARE_PROVIDER_SITE_OTHER): Payer: BC Managed Care – PPO | Admitting: Adult Health

## 2020-10-15 DIAGNOSIS — G43009 Migraine without aura, not intractable, without status migrainosus: Secondary | ICD-10-CM | POA: Diagnosis not present

## 2020-10-15 NOTE — Progress Notes (Addendum)
PATIENT: Michael Carter DOB: [birth year]  REASON FOR VISIT: follow up HISTORY FROM: patient  Virtual Visit via Video Note  I connected with Michael Carter on 10/15/20 at  2:00 PM EDT by a video enabled telemedicine application located remotely at [facility] and verified that I am speaking with the correct person using two identifiers who was located at their own home.   I discussed the limitations of evaluation and management by telemedicine and the availability of in person appointments. The patient expressed understanding and agreed to proceed.   PATIENT: Michael Carter DOB: Aug 16, 1985  REASON FOR VISIT: follow up HISTORY FROM: patient  HISTORY OF PRESENT ILLNESS: Today 10/15/20  Michael Carter is a 35 year old male with a history of migraine headaches.  He returns today for follow-up.  He states that his headaches have improved.  He has approximately 1 headache a month.  He states that Maxalt does resolve his headaches but it does make him very tired.  Overall he feels that he is doing well.  He returns today for video visit  HISTORY (copied from Dr. Trevor Carter note) Michael Carter is a 35 y.o. male here as requested by Michael Royalty, MD for headaches. PMHx OSA, liver disease, headache, DDD, CKD, chronic pain. He has had them for years. Worse in July and starts in the left eye, throbbing,nausea,vomiting,photophobia. Having them once a week on average. They last 4-6 hours. His vision gets blurry but then will come back fine, not new, he has had 3 TBIs and maybe related to that, hasn't changed in years, this year he wakes with headaches, he has a cpap and he is using it but he loses his mask he tries but he hates, he has gained weight, headaches not positional or exertional, No flashing lights, no aura, no numbness, tingling, weakness. Butalbital helps. Lights make it worse. He doesn't know what triggers them. It slowly comes on and intensifies. He takes a pill but he may still  progress. 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 No other focal neurologic deficits, associated symptoms, inciting events or modifiable factors.  Reviewed notes, labs and imaging from outside physicians, which showed:  I reviewed Michael Carter notes: Patient described worsening headaches, sudden in onset, at the time he was seen there last he was describing daily headaches for 4 hours, left frontal area, left temporal area, right frontal area in the right temporal area.  Symptoms included nausea, vomiting and photophobia, treating her with aspirin, poor symptom control, patient states headaches began when he started taking dicyclomine in July 2021 but since stopping dicyclomine still has headaches, he also discussed the rashes followed by new medication use doxycycline and prednisone, also knee pain and vitamin D deficiency.  For his headache he was started on butalbital every 4 hours without a refill.  Also diagnosed with prediabetes, I reviewed labs which showed a recent urine drug screen which was negative, CBC in September 2018 was normal, CMP had some decreased chloride, elevated glucose, really unremarkable despite some values being out of range.   REVIEW OF SYSTEMS: Out of a complete 14 system review of symptoms, the patient complains only of the following symptoms, and all other reviewed systems are negative.  See HPI  ALLERGIES: Allergies  Allergen Reactions  . Doxycycline   . Percocet [Oxycodone-Acetaminophen] Other (See Comments)    "makes pain worse"    HOME MEDICATIONS: Outpatient Medications  Prior to Visit  Medication Sig Dispense Refill  . butalbital-aspirin-caffeine (FIORINAL) 50-325-40 MG capsule Take 1 capsule by mouth every 4 (four) hours as needed.    . cetirizine (ZYRTEC) 10 MG tablet Take 10 mg by mouth daily as needed for allergies.    . fluticasone (FLONASE) 50 MCG/ACT nasal spray  Place 2 sprays into both nostrils daily.    Marland Kitchen omeprazole (PRILOSEC) 40 MG capsule Take 40 mg by mouth daily.    . ondansetron (ZOFRAN ODT) 8 MG disintegrating tablet Take 1 tablet (8 mg total) by mouth every 8 (eight) hours as needed for nausea or vomiting. 10 tablet 0  . ondansetron (ZOFRAN-ODT) 4 MG disintegrating tablet Take 1-2 tablets (4-8 mg total) by mouth every 8 (eight) hours as needed for nausea. Take for migraines. May take with Rizatriptan. 30 tablet 3  . rizatriptan (MAXALT-MLT) 10 MG disintegrating tablet Take 1 tablet (10 mg total) by mouth as needed for migraine. May repeat in 2 hours if needed 9 tablet 11  . Vitamin D, Ergocalciferol, (DRISDOL) 1.25 MG (50000 UNIT) CAPS capsule Take by mouth.     No facility-administered medications prior to visit.    PAST MEDICAL HISTORY: Past Medical History:  Diagnosis Date  . Acid reflux   . Asthma   . Chronic pain   . CKD (chronic kidney disease)   . DDD (degenerative disc disease), lumbar   . Headache   . Liver disease   . OSA (obstructive sleep apnea)   . Plantar fasciitis   . Vitamin D deficiency     PAST SURGICAL HISTORY: Past Surgical History:  Procedure Laterality Date  . NO PAST SURGERIES      FAMILY HISTORY: Family History  Problem Relation Age of Onset  . Breast cancer Sister   . Ovarian cancer Sister   . Hypertension Paternal Grandmother   . Diabetes Paternal Grandfather     SOCIAL HISTORY: Social History   Socioeconomic History  . Marital status: Married    Spouse name: Not on file  . Number of children: Not on file  . Years of education: Not on file  . Highest education level: Not on file  Occupational History  . Not on file  Tobacco Use  . Smoking status: Former Smoker    Years: 5.00  . Smokeless tobacco: Never Used  . Tobacco comment: quit age 18  Substance and Sexual Activity  . Alcohol use: No  . Drug use: Not Currently  . Sexual activity: Not on file  Other Topics Concern  . Not on  file  Social History Narrative  . Not on file   Social Determinants of Health   Financial Resource Strain: Not on file  Food Insecurity: Not on file  Transportation Needs: Not on file  Physical Activity: Not on file  Stress: Not on file  Social Connections: Not on file  Intimate Partner Violence: Not on file      PHYSICAL EXAM Generalized: Well developed, in no acute distress   Neurological examination  Mentation: Alert oriented to time, place, history taking. Follows all commands speech and language fluent Cranial nerve II-XII:Extraocular movements were full. Facial symmetry noted. uvula tongue midline. Head turning and shoulder shrug  were normal and symmetric. Motor: Good strength throughout subjectively per patient Sensory: Sensory testing is intact to soft touch on all 4 extremities subjectively per patient Coordination: Cerebellar testing reveals good finger-nose-finger  Gait and station: Patient is able to stand from a seated position. gait is normal.  Reflexes: UTA  DIAGNOSTIC DATA (LABS, IMAGING, TESTING) - I reviewed patient records, labs, notes, testing and imaging myself where available.  Lab Results  Component Value Date   WBC 6.6 03/24/2017   HGB 13.5 03/24/2017   HCT 41.1 03/24/2017   MCV 91.1 03/24/2017   PLT 156 03/24/2017      Component Value Date/Time   NA 136 03/24/2017 1138   K 3.8 03/24/2017 1138   CL 100 (L) 03/24/2017 1138   CO2 28 03/24/2017 1138   GLUCOSE 108 (H) 03/24/2017 1138   BUN 5 (L) 03/24/2017 1138   CREATININE 1.05 03/24/2017 1138   CALCIUM 8.5 (L) 03/24/2017 1138   PROT 7.1 03/24/2017 1138   ALBUMIN 3.4 (L) 03/24/2017 1138   AST 29 03/24/2017 1138   ALT 34 03/24/2017 1138   ALKPHOS 34 (L) 03/24/2017 1138   BILITOT 0.6 03/24/2017 1138   GFRNONAA >60 03/24/2017 1138   GFRAA >60 03/24/2017 1138     ASSESSMENT AND PLAN 35 y.o. year old male  has a past medical history of Acid reflux, Asthma, Chronic pain, CKD (chronic  kidney disease), DDD (degenerative disc disease), lumbar, Headache, Liver disease, OSA (obstructive sleep apnea), Plantar fasciitis, and Vitamin D deficiency. here with:  1.  Migraine headaches  --Continue Maxalt for abortive therapy --Advised that if headache frequency increases he should let us know.  Otherwise he can follow-up with his primary care.    Butch Penny, MSN, NP-C 10/15/2020, 1:54 PM [facility] [address] [place], [place] [address] [phone]   agree with assessment and plan as stated.     Naomie Dean, MD [facility]

## 2022-10-05 ENCOUNTER — Other Ambulatory Visit: Payer: Self-pay

## 2022-10-05 ENCOUNTER — Ambulatory Visit: Payer: No Typology Code available for payment source | Attending: Chiropractic Medicine

## 2022-10-05 DIAGNOSIS — R6889 Other general symptoms and signs: Secondary | ICD-10-CM | POA: Insufficient documentation

## 2022-10-05 DIAGNOSIS — M549 Dorsalgia, unspecified: Secondary | ICD-10-CM | POA: Insufficient documentation

## 2022-10-05 DIAGNOSIS — M2569 Stiffness of other specified joint, not elsewhere classified: Secondary | ICD-10-CM | POA: Insufficient documentation

## 2022-10-05 NOTE — Therapy (Deleted)
OUTPATIENT PHYSICAL THERAPY LOWER EXTREMITY EVALUATION   Patient Name: Michael Carter MRN: [id number] DOB:[birth year], 37 y.o., male Today's Date: 10/05/2022  END OF SESSION:   Past Medical History:  Diagnosis Date   Acid reflux    Asthma    Chronic pain    CKD (chronic kidney disease)    DDD (degenerative disc disease), lumbar    Headache    Liver disease    OSA (obstructive sleep apnea)    Plantar fasciitis    Vitamin D deficiency    Past Surgical History:  Procedure Laterality Date   NO PAST SURGERIES     Patient Active Problem List   Diagnosis Date Noted   Migraine without aura and without status migrainosus, not intractable 04/01/2020    PCP: ***  REFERRING PROVIDER: ***  REFERRING DIAG: ***  THERAPY DIAG:  No diagnosis found.  Rationale for Evaluation and Treatment: {HABREHAB:27488}  ONSET DATE: ***  SUBJECTIVE:   SUBJECTIVE STATEMENT: ***  PERTINENT HISTORY: *** PAIN:  Are you having pain? {OPRCPAIN:27236}  PRECAUTIONS: {Therapy precautions:24002}  WEIGHT BEARING RESTRICTIONS: {Yes ***/No:24003}  FALLS:  Has patient fallen in last 6 months? {fallsyesno:27318}  LIVING ENVIRONMENT: Lives with: {OPRC lives with:25569::"lives with their family"} Lives in: {Lives in:25570} Stairs: {opstairs:27293} Has following equipment at home: {Assistive devices:23999}  OCCUPATION: ***  PLOF: {PLOF:24004}  PATIENT GOALS: ***  NEXT MD VISIT: ***  OBJECTIVE:   DIAGNOSTIC FINDINGS: ***  PATIENT SURVEYS:  {rehab surveys:24030}  COGNITION: Overall cognitive status: {cognition:24006}     SENSATION: {sensation:27233}  EDEMA:  {edema:24020}  MUSCLE LENGTH: Hamstrings: Right *** deg; Left *** deg Thomas test: Right *** deg; Left *** deg  POSTURE: {posture:25561}  PALPATION: ***  LOWER EXTREMITY ROM:  {AROM/PROM:27142} ROM Right eval Left eval  Hip flexion    Hip extension    Hip abduction    Hip adduction    Hip internal rotation     Hip external rotation    Knee flexion    Knee extension    Ankle dorsiflexion    Ankle plantarflexion    Ankle inversion    Ankle eversion     (Blank rows = not tested)  LOWER EXTREMITY MMT:  MMT Right eval Left eval  Hip flexion    Hip extension    Hip abduction    Hip adduction    Hip internal rotation    Hip external rotation    Knee flexion    Knee extension    Ankle dorsiflexion    Ankle plantarflexion    Ankle inversion    Ankle eversion     (Blank rows = not tested)  LOWER EXTREMITY SPECIAL TESTS:  {LEspecialtests:26242}  FUNCTIONAL TESTS:  {Functional tests:24029}  GAIT: Distance walked: *** Assistive device utilized: {Assistive devices:23999} Level of assistance: {Levels of assistance:24026} Comments: ***   TODAY'S TREATMENT:  DATE: ***    PATIENT EDUCATION:  Education details: *** Person educated: {Person educated:25204} Education method: {Education Method:25205} Education comprehension: {Education Comprehension:25206}  HOME EXERCISE PROGRAM: ***  ASSESSMENT:  CLINICAL IMPRESSION: Patient is a *** y.o. *** who was seen today for physical therapy evaluation and treatment for ***.   OBJECTIVE IMPAIRMENTS: {opptimpairments:25111}.   ACTIVITY LIMITATIONS: {activitylimitations:27494}  PARTICIPATION LIMITATIONS: {participationrestrictions:25113}  PERSONAL FACTORS: {Personal factors:25162} are also affecting patient's functional outcome.   REHAB POTENTIAL: {rehabpotential:25112}  CLINICAL DECISION MAKING: {clinical decision making:25114}  EVALUATION COMPLEXITY: {Evaluation complexity:25115}   GOALS: Goals reviewed with patient? {yes/no:20286}  SHORT TERM GOALS: Target date: *** *** Baseline: Goal status: {GOALSTATUS:25110}  2.  *** Baseline:  Goal status: {GOALSTATUS:25110}  3.  *** Baseline:  Goal  status: {GOALSTATUS:25110}  4.  *** Baseline:  Goal status: {GOALSTATUS:25110}  5.  *** Baseline:  Goal status: {GOALSTATUS:25110}  6.  *** Baseline:  Goal status: {GOALSTATUS:25110}  LONG TERM GOALS: Target date: ***  *** Baseline:  Goal status: {GOALSTATUS:25110}  2.  *** Baseline:  Goal status: {GOALSTATUS:25110}  3.  *** Baseline:  Goal status: {GOALSTATUS:25110}  4.  *** Baseline:  Goal status: {GOALSTATUS:25110}  5.  *** Baseline:  Goal status: {GOALSTATUS:25110}  6.  *** Baseline:  Goal status: {GOALSTATUS:25110}   PLAN:  PT FREQUENCY: {rehab frequency:25116}  PT DURATION: {rehab duration:25117}  PLANNED INTERVENTIONS: {rehab planned interventions:25118::"Therapeutic exercises","Therapeutic activity","Neuromuscular re-education","Balance training","Gait training","Patient/Family education","Self Care","Joint mobilization"}  PLAN FOR NEXT SESSION: ***   Stavros Cail L Darron Stuck, PT 10/05/2022, 9:31 AM

## 2022-10-05 NOTE — Therapy (Signed)
OUTPATIENT PHYSICAL THERAPY THORACOLUMBAR EVALUATION   Patient Name: Michael BoehringerDavid Carter MRN: [id number] DOB:10-07-1985, 37 y.o., male Today's Date: 10/05/2022  END OF SESSION:  PT End of Session - 10/05/22 1328     Visit Number 1    Date for PT Re-Evaluation 11/30/22    Authorization Type [facility]    Progress Note Due on Visit 10    PT Start Time 0930    PT Stop Time 1018    PT Time Calculation (min) 48 min    Activity Tolerance Patient tolerated treatment well    Behavior During Therapy WFL for tasks assessed/performed             Past Medical History:  Diagnosis Date   Acid reflux    Asthma    Chronic pain    CKD (chronic kidney disease)    DDD (degenerative disc disease), lumbar    Headache    Liver disease    OSA (obstructive sleep apnea)    Plantar fasciitis    Vitamin D deficiency    Past Surgical History:  Procedure Laterality Date   NO PAST SURGERIES     Patient Active Problem List   Diagnosis Date Noted   Migraine without aura and without status migrainosus, not intractable 04/01/2020    PCP: Knox RoyaltyJones , En[place]  REFERRING PROVIDER: Su Hoffarol Knowles  REFERRING DIAG: myofscial pain syndrome, midline back pain  Rationale for Evaluation and Treatment: Rehabilitation  THERAPY DIAG:  Mid back pain  Back stiffness  Impaired tolerance of activity  ONSET DATE: 3 months ago  SUBJECTIVE:                                                                                                                                                                                           SUBJECTIVE STATEMENT: Pain shoots up spine with certain movements at work radiates from lower back up into upper back, center of back, not L or R  PERTINENT HISTORY:  December was hurt at work when shirt was caught in Financial controllerconveyer belt, which pulled him in partially and he had to forcibly push himself out of the machine backwards.  MD has recommended injections.  B knee pain chronic. States that referring  MD wants him to participate in aquatic PT but   PAIN:  Are you having pain? Yes: NPRS scale: 10/10 Pain location: lower back radiating up to cervico thoracic jxn Pain description: intermittent, very sharp Aggravating factors: certain reaching activities sometimes when reaching Relieving factors: pain lasts several hours when it occurs.  Takes tramadol with flare ups  PRECAUTIONS: None  WEIGHT BEARING RESTRICTIONS: No  FALLS:  Has patient fallen  in last 6 months? No  LIVING ENVIRONMENT: Lives with: lives with their family, lives with their partner, and lives with their son Lives in: House/apartment Stairs: Yes: Internal: 4 steps; can reach both Has following equipment at home: None  OCCUPATION: runs a roll foil machine, lots of standing and walking, 8 hr shifts ,3rd  PLOF: Independent  PATIENT GOALS: get rid of pain back , and be able to lift, move better   NEXT MD VISIT: 4 weeks  OBJECTIVE:   DIAGNOSTIC FINDINGS:  Had LS x rays and MRI but not available today  PATIENT SURVEYS:  Modified Oswestry 20/50 or 40%   SCREENING FOR RED FLAGS: Bowel or bladder incontinence: No Spinal tumors: No Cauda equina syndrome: No Compression fracture: No Abdominal aneurysm: No  COGNITION: Overall cognitive status: Within functional limits for tasks assessed     SENSATION: Reports B hand numbness with prolonged shoulder elevation/ I.e., driving  MUSCLE LENGTH: Hamstrings: Right -40 deg; Left -40 de  POSTURE: L iliac crest high, L shoulder low, flattened LS spine, loss of gluteal mass,  L side bending with forward flexion standing  PALPATION: PA glides thoraco lumbar junciton and post ribs 6 to 9 B tender  LUMBAR ROM:   AROM eval  Flexion 50%  Extension wnl  Right lateral flexion 75%  Left lateral flexion 50%  Right rotation   Left rotation      LOWER EXTREMITY ROM:   all LE ROM WNL    LOWER EXTREMITY MMT:  all MMT LE's wfl B  LUMBAR SPECIAL TESTS:  Straight  leg raise test: Negative and FABER test: Negative  GAIT: Distance walked: over 100' in the clinic without deficits   TODAY'S TREATMENT:                                                                                                                              DATE: 10/05/22:  Manual:  L side lying for muscle energy to improve L lumbar flexion/rot/SB restriction, 5 reps, 5 sec holds.  Reassessed LS forward bending in standing immediately afterward and improved motion noted  Therex: instructed in standing lumbar flexion R rotation and R side bending stretch with R hands on R lateral lower leg, and R foot on 4" step stool, to gap L lumbar spine  Also standing in door way for pec stretch, cues to maintain shoulders at 90 degrees abd.     PATIENT EDUCATION:  Education details: POC, goals Person educated: Patient Education method: Explanation, Demonstration, Tactile cues, and Verbal cues Education comprehension: verbalized understanding  HOME EXERCISE PROGRAM: None printed, instructed as above  ASSESSMENT:  CLINICAL IMPRESSION: Patient is a 37 y.o. male who was seen today for physical therapy evaluation and treatment for midline back pain.  Presents with decreased movement Lumbar spine for flexion, as well as provocation of Sx with segmental mobility testing thoraco lumbar junction and post ribs 6 to 9.  He did demonstrate improved movement after  specific stretching, muscle energy to improve his movement in that area.  He will benefit from skilled PT.  Has orders to transition to aquatic therapy, scheduled for first week in May, will establish a land exercise program  and utilize other modalities, manual skills in the meantime.  Therex to focus on lumbar flexion ORM/ stretching and overall stabilization.  OBJECTIVE IMPAIRMENTS: decreased activity tolerance, decreased mobility, difficulty walking, decreased ROM, postural dysfunction, and pain.   ACTIVITY LIMITATIONS: lifting, bending, sitting,  and locomotion level  PARTICIPATION LIMITATIONS: driving, community activity, and yard work  PERSONAL FACTORS: Fitness and 1-2 comorbidities: chronic DJD knees, obesity  are also affecting patient's functional outcome.   REHAB POTENTIAL: Good  CLINICAL DECISION MAKING: Stable/uncomplicated  EVALUATION COMPLEXITY: Low   GOALS: Goals reviewed with patient? Yes  SHORT TERM GOALS: Target date: 2 weeks 10/19/22  I HEP Baseline:initiated at eval Goal status: INITIAL  LONG TERM GOALS: Target date: 11/30/22  Oswestry improve to 30% disability Baseline: 40% Goal status: INITIAL  2.  Improve lumbar ROM for forward bending to wnl without pain provocation spine Baseline: 50% with pain Goal status: INITIAL  3.  Improve Sx B Ue's, reduce frequency of B UE paresthesia to 0% Baseline: occurs with driving, activities using B hands Goal status: INITIAL  PLAN:  PT FREQUENCY: 1-2x/week  PT DURATION: 8 weeks  PLANNED INTERVENTIONS: Therapeutic exercises, Therapeutic activity, Neuromuscular re-education, Balance training, Gait training, Patient/Family education, Self Care, and Joint mobilization.  PLAN FOR NEXT SESSION: additional therx, eval for dry needling, mobilization lumbar and thoracic    Kalika Smay L Culley Hedeen, PT 10/05/2022, 5:24 PM

## 2022-10-06 ENCOUNTER — Encounter: Payer: Self-pay | Admitting: Gastroenterology

## 2022-10-06 ENCOUNTER — Ambulatory Visit (INDEPENDENT_AMBULATORY_CARE_PROVIDER_SITE_OTHER): Payer: No Typology Code available for payment source | Admitting: Gastroenterology

## 2022-10-06 VITALS — BP 100/64 | HR 76 | Ht 68.0 in | Wt 309.6 lb

## 2022-10-06 DIAGNOSIS — K219 Gastro-esophageal reflux disease without esophagitis: Secondary | ICD-10-CM | POA: Diagnosis not present

## 2022-10-06 DIAGNOSIS — R194 Change in bowel habit: Secondary | ICD-10-CM | POA: Diagnosis not present

## 2022-10-06 DIAGNOSIS — R197 Diarrhea, unspecified: Secondary | ICD-10-CM | POA: Diagnosis not present

## 2022-10-06 MED ORDER — NA SULFATE-K SULFATE-MG SULF 17.5-3.13-1.6 GM/177ML PO SOLN: 1.0000 | Freq: Once | ORAL | 0 refills | Status: AC

## 2022-10-06 NOTE — Patient Instructions (Signed)
You have been scheduled for an endoscopy and colonoscopy. Please follow the written instructions given to you at your visit today. Please pick up your prep supplies at the pharmacy within the next 1-3 days. If you use inhalers (even only as needed), please bring them with you on the day of your procedure.  The [facility] providers would like to encourage you to use MYCHART to communicate with providers for non-urgent requests or questions.  Due to long hold times on the telephone, sending your provider a message by MYCHART may be a faster and more efficient way to get a response.  Please allow 48 business hours for a response.  Please remember that this is for non-urgent requests.   Due to recent changes in healthcare laws, you may see the results of your imaging and laboratory studies on MyChart before your provider has had a chance to review them.  We understand that in some cases there may be results that are confusing or concerning to you. Not all laboratory results come back in the same time frame and the provider may be waiting for multiple results in order to interpret others.  Please give us 48 hours in order for your provider to thoroughly review all the results before contacting the office for clarification of your results.   Thank you for choosing me and [facility].  Malcolm T. Stark, Jr., MD., FACG  

## 2022-10-06 NOTE — Progress Notes (Signed)
Assessment    Change in bowel habits, diarrhea and changes in color - R/O neoplasm, microscopic colitis, less likely IBD GERD. R/O esophagitis MASLD OSA CKD   Recommendations   Continue omeprazole 40 mg daily and follow antireflux measures.  Famotidine 20 mg daily as needed for breakthrough symptoms. Continue weight loss program as advised however phentermine will be held 10 days prior to his procedures. Schedule colonoscopy and EGD. The risks (including bleeding, perforation, infection, missed lesions, medication reactions and possible hospitalization or surgery if complications occur), benefits, and alternatives to colonoscopy with possible biopsy and possible polypectomy were discussed with the patient and they consent to proceed. The risks (including bleeding, perforation, infection, missed lesions, medication reactions and possible hospitalization or surgery if complications occur), benefits, and alternatives to endoscopy with possible biopsy and possible dilation were discussed with the patient and they consent to proceed.       HPI   Chief complaint: Change in bowel habits, diarrhea, GERD  Patient profile:  Michael Carter is a 37 y.o. male referred by Teodora Medici, PA-C, [facility], with change in bowel habits, intermittent diarrhea alternating with normal bowel movements, changes in stool color and GERD. He notes his stools range in color from light to dark however he has not noted melena or hematochezia.  He has occasional lower abdominal pains that start in his back and wrap around to the lower abdomen.  He has GERD that is currently well-controlled on omeprazole.  He was recommended to undergo colonoscopy and EGD at the [facility] however MAC sedation was felt to be necessary and he was referred here.  He is on a weight loss diet with phentermine for management of hepatic steatosis and has intentionally lost 20 pounds.  Denies change in stool caliber, melena,  hematochezia, nausea, vomiting, dysphagia, chest pain.   Previous Labs / Imaging::    Latest Ref Rng & Units 03/24/2017   11:38 AM 03/21/2017    6:48 AM 05/10/2016    7:51 AM  CBC  WBC 4.0 - 10.5 K/uL 6.6  10.8  6.5   Hemoglobin 13.0 - 17.0 g/dL 42.3  95.3  20.2   Hematocrit 39.0 - 52.0 % 41.1  43.1  42.1   Platelets 150 - 400 K/uL 156  130  140     Lab Results  Component Value Date   LIPASE 25 03/21/2017      Latest Ref Rng & Units 03/24/2017   11:38 AM 03/21/2017    6:48 AM 05/10/2016    7:51 AM  CMP  Glucose 65 - 99 mg/dL 334  356  861   BUN 6 - 20 mg/dL 5  13  9    Creatinine 0.61 - 1.24 mg/dL 6.83  7.29  0.21   Sodium 135 - 145 mmol/L 136  137  140   Potassium 3.5 - 5.1 mmol/L 3.8  4.1  4.2   Chloride 101 - 111 mmol/L 100  105  103   CO2 22 - 32 mmol/L 28  23  28    Calcium 8.9 - 10.3 mg/dL 8.5  8.7  8.7   Total Protein 6.5 - 8.1 g/dL 7.1  6.8    Total Bilirubin 0.3 - 1.2 mg/dL 0.6  1.2    Alkaline Phos 38 - 126 U/L 34  37    AST 15 - 41 U/L 29  26    ALT 17 - 63 U/L 34  35       Previous GI evaluation  Endoscopies:  EGD 2010 apparently showed reflux esophagitis (report not available)  Imaging:  CT Abdomen Pelvis W Contrast CLINICAL DATA:  Right upper quadrant pain with nausea and vomiting being today. Suspect gastroenteritis or colitis.  EXAM: CT ABDOMEN AND PELVIS WITH CONTRAST  TECHNIQUE: Multidetector CT imaging of the abdomen and pelvis was performed using the standard protocol following bolus administration of intravenous contrast.  CONTRAST:  < 100 mL ISOVUE-300 IOPAMIDOL (ISOVUE-300) INJECTION 61%  COMPARISON:  None.  FINDINGS: Lower chest: No acute abnormality.  Hepatobiliary: Liver and biliary tree are normal. Possible minimal cholelithiasis.  Pancreas: Within normal.  Spleen: Within normal.  Adrenals/Urinary Tract: Left adrenal gland is normal. There is a 1.3 cm nodule over the lateral limb of the right adrenal gland likely  an adenoma. Kidneys are normal in size without hydronephrosis or nephrolithiasis. Ureters and bladder are normal.  Stomach/Bowel: Stomach and small bowel are normal. Appendix is normal. Colon is normal.  Vascular/Lymphatic: Vascular structures are within normal. Few small subcentimeter lymph nodes adjacent distal esophagus and gastroesophageal junction/ gastrohepatic ligament and celiac axis.  Reproductive: Within normal.  Other: Small left paraumbilical hernia containing only peritoneal fat. Small left inguinal hernia containing only peritoneal fat.  Musculoskeletal: Within normal.  IMPRESSION: No acute findings in the abdomen/pelvis.  Possible subtle cholelithiasis.  1.3 cm right adrenal nodule likely an adenoma.  Small left her umbilical hernia containing only peritoneal fat. Small left inguinal hernia containing only peritoneal fat.  Electronically Signed   By: Elberta Fortisaniel  Boyle M.D.   On: 03/21/2017 17:18 DG Chest 2 View CLINICAL DATA:  Migraines.  Cough and fever.  EXAM: CHEST  2 VIEW  COMPARISON:  May 10, 2016  FINDINGS: The heart size and mediastinal contours are within normal limits. Both lungs are clear. The visualized skeletal structures are unremarkable.  IMPRESSION: No active cardiopulmonary disease.  Electronically Signed   By: Gerome Samavid  Williams III M.D   On: 03/21/2017 11:51    Past Medical History:  Diagnosis Date   Acid reflux    Asthma    Chronic pain    CKD (chronic kidney disease)    DDD (degenerative disc disease), lumbar    Headache    Liver disease    OSA (obstructive sleep apnea)    Plantar fasciitis    Vitamin D deficiency    Past Surgical History:  Procedure Laterality Date   NO PAST SURGERIES     Family History  Problem Relation Age of Onset   Breast cancer Sister    Ovarian cancer Sister    Hypertension Paternal Grandmother    Diabetes Paternal Grandfather    Social History   Tobacco Use   Smoking status:  Former    Years: 5    Types: Cigarettes   Smokeless tobacco: Never   Tobacco comments:    quit age 37  Substance Use Topics   Alcohol use: No   Drug use: Not Currently   Current Outpatient Medications  Medication Sig Dispense Refill   albuterol (VENTOLIN HFA) 108 (90 Base) MCG/ACT inhaler Inhale 2 puffs into the lungs every 6 (six) hours as needed.     cetirizine (ZYRTEC) 10 MG tablet Take 10 mg by mouth daily as needed for allergies.     Cholecalciferol 50 MCG (2000 UT) TABS Take 1 tablet by mouth daily.     diclofenac Sodium (VOLTAREN) 1 % GEL Apply 2 g topically 4 (four) times daily.     famotidine (PEPCID) 20 MG tablet Take 20 mg by  mouth daily.     fluticasone (FLONASE) 50 MCG/ACT nasal spray Place 2 sprays into both nostrils daily.     topiramate (TOPAMAX) 25 MG tablet Take 1 tablet by mouth daily.     traMADol (ULTRAM) 50 MG tablet Take 50 mg by mouth every 8 (eight) hours as needed.     No current facility-administered medications for this visit.   Allergies  Allergen Reactions   Doxycycline    Percocet [Oxycodone-Acetaminophen] Other (See Comments)    "makes pain worse"    Review of Systems: All other systems reviewed and negative except where noted in HPI.    Physical Exam    Wt Readings from Last 3 Encounters:  10/06/22 (!) 309 lb 9.6 oz (140.4 kg)  03/29/20 (!) 338 lb (153.3 kg)  03/21/17 270 lb (122.5 kg)    BP 100/64 (BP Location: Left Arm, Patient Position: Sitting, Cuff Size: Large)   Pulse 76   Ht 5\' 8"  (1.727 m)   Wt (!) 309 lb 9.6 oz (140.4 kg)   BMI 47.07 kg/m  Constitutional:  Generally well appearing male in no acute distress. HEENT: Pupils normal.  Conjunctivae are normal. No scleral icterus. No oral lesions or deformities noted.  Neck: Supple.  Cardiac: Normal rate, regular rhythm without murmurs. Pulmonary/chest: Effort normal and breath sounds normal. No wheezing, rales or rhonchi. Abdominal: Soft, nondistended, nontender. Active bowel  sounds. No palpable HSM, masses or hernias. Rectal: Deferred to colonoscopy Extremities: No edema or deformities noted Neurological: Alert and oriented to person, place and time. Psychiatric: Pleasant. Normal mood and affect. Behavior is normal. Skin: Skin is warm and dry. No rashes noted.  Claudette Head, MD   cc:  Referring Provider Knox Royalty, MD

## 2022-10-07 ENCOUNTER — Ambulatory Visit: Payer: No Typology Code available for payment source

## 2022-10-07 ENCOUNTER — Other Ambulatory Visit: Payer: Self-pay

## 2022-10-07 DIAGNOSIS — R6889 Other general symptoms and signs: Secondary | ICD-10-CM

## 2022-10-07 DIAGNOSIS — M549 Dorsalgia, unspecified: Secondary | ICD-10-CM | POA: Diagnosis not present

## 2022-10-07 DIAGNOSIS — M2569 Stiffness of other specified joint, not elsewhere classified: Secondary | ICD-10-CM

## 2022-10-07 NOTE — Therapy (Signed)
OUTPATIENT PHYSICAL THERAPY THORACOLUMBAR EVALUATION   Patient Name: Michael BoehringerDavid Carter MRN: [id number] DOB:17-Jun-1986, 37 y.o., male Today's Date: 10/07/2022  END OF SESSION:  PT End of Session - 10/07/22 1354     Visit Number 2    Date for PT Re-Evaluation 11/30/22    Authorization Type [facility]    Progress Note Due on Visit 10    PT Start Time 1354    PT Stop Time 1430    PT Time Calculation (min) 36 min    Activity Tolerance Patient tolerated treatment well    Behavior During Therapy WFL for tasks assessed/performed              Past Medical History:  Diagnosis Date   Acid reflux    Asthma    Chronic pain    CKD (chronic kidney disease)    DDD (degenerative disc disease), lumbar    Headache    Liver disease    OSA (obstructive sleep apnea)    Plantar fasciitis    Vitamin D deficiency    Past Surgical History:  Procedure Laterality Date   NO PAST SURGERIES     Patient Active Problem List   Diagnosis Date Noted   Migraine without aura and without status migrainosus, not intractable 04/01/2020    PCP: Knox RoyaltyJones , [place]  REFERRING PROVIDER: Su Hoffarol Knowles  REFERRING DIAG: myofscial pain syndrome, midline back pain  Rationale for Evaluation and Treatment: Rehabilitation  THERAPY DIAG:  Mid back pain  Back stiffness  Impaired tolerance of activity  ONSET DATE: 3 months ago  SUBJECTIVE:                                                                                                                                                                                           SUBJECTIVE STATEMENT: reports improved numbness and tingling B hands with driving his kids to and from school today.   PERTINENT HISTORY:  December was hurt at work when shirt was caught in conveyer belt, which pulled him in partially and he had to forcibly push himself out of the machine backwards.  MD has recommended injections.  B knee pain chronic. States that referring MD wants him to participate  in aquatic PT but   PAIN:  Are you having pain? Yes: NPRS scale: 10/10 Pain location: lower back radiating up to cervico thoracic jxn Pain description: intermittent, very sharp Aggravating factors: certain reaching activities sometimes when reaching Relieving factors: pain lasts several hours when it occurs.  Takes tramadol with flare ups  PRECAUTIONS: None  WEIGHT BEARING RESTRICTIONS: No  FALLS:  Has patient fallen in last 6 months? No  LIVING ENVIRONMENT: Lives with: lives with their family, lives with their partner, and lives with their son Lives in: House/apartment Stairs: Yes: Internal: 4 steps; can reach both Has following equipment at home: None  OCCUPATION: runs a roll foil machine, lots of standing and walking, 8 hr shifts ,3rd  PLOF: Independent  PATIENT GOALS: get rid of pain back , and be able to lift, move better   NEXT MD VISIT: 4 weeks  OBJECTIVE:   DIAGNOSTIC FINDINGS:  Had LS x rays and MRI but not available today  PATIENT SURVEYS:  Modified Oswestry 20/50 or 40%   SCREENING FOR RED FLAGS: Bowel or bladder incontinence: No Spinal tumors: No Cauda equina syndrome: No Compression fracture: No Abdominal aneurysm: No  COGNITION: Overall cognitive status: Within functional limits for tasks assessed     SENSATION: Reports B hand numbness with prolonged shoulder elevation/ I.e., driving  MUSCLE LENGTH: Hamstrings: Right -40 deg; Left -40 de  POSTURE: L iliac crest high, L shoulder low, flattened LS spine, loss of gluteal mass,  L side bending with forward flexion standing  PALPATION: PA glides thoraco lumbar junciton and post ribs 6 to 9 B tender  LUMBAR ROM:   AROM eval  Flexion 50%  Extension wnl  Right lateral flexion 75%  Left lateral flexion 50%  Right rotation   Left rotation      LOWER EXTREMITY ROM:   all LE ROM WNL    LOWER EXTREMITY MMT:  all MMT LE's wfl B  LUMBAR SPECIAL TESTS:  Straight leg raise test: Negative and  FABER test: Negative  GAIT: Distance walked: over 100' in the clinic without deficits   TODAY'S TREATMENT:                                                                                                                              DATE:  10/06/21: Manual:  prone for PA HVLA mobs mid and lower thoracic spine PA glides B post ribs mid and lower L side lying for muscle energy to isolate flex, rot. SB stretch lower L lumbar spine one set 8 reps, 5 sec holds  Therex:  lat rows 45#, 2 sets 15 reps  B shoulder rows 45# 2 sets 15 reps Supine with 65 cm ball under legs, for B knee to chest, LTR No new ex added for home.  10/05/22:  Manual:  L side lying for muscle energy to improve L lumbar flexion/rot/SB restriction, 5 reps, 5 sec holds.  Reassessed LS forward bending in standing immediately afterward and improved motion noted  Therex: instructed in standing lumbar flexion R rotation and R side bending stretch with R hands on R lateral lower leg, and R foot on 4" step stool, to gap L lumbar spine  Also standing in door way for pec stretch, cues to maintain shoulders at 90 degrees abd.     PATIENT EDUCATION:  Education details: POC, goals Person educated: Patient Education method: Programmer, multimedia, Demonstration,  Tactile cues, and Verbal cues Education comprehension: verbalized understanding  HOME EXERCISE PROGRAM: None printed, instructed as above  ASSESSMENT:  CLINICAL IMPRESSION: Patient is a 37 y.o. male who was seen today for physical therapy treatment for midline back pain. Rechecked lumbar flexion and  he did not have deviation to L in lumbar spine today., as well as demonstrated increased lumbar flexion ROM.  Responded well during session today to both manual techniques and therex. Will continue with additional lumbar flexion ex and thoracic spine extension stretching/ strengthening ex. OBJECTIVE IMPAIRMENTS: decreased activity tolerance, decreased mobility, difficulty walking,  decreased ROM, postural dysfunction, and pain.   ACTIVITY LIMITATIONS: lifting, bending, sitting, and locomotion level  PARTICIPATION LIMITATIONS: driving, community activity, and yard work  PERSONAL FACTORS: Fitness and 1-2 comorbidities: chronic DJD knees, obesity  are also affecting patient's functional outcome.   REHAB POTENTIAL: Good  CLINICAL DECISION MAKING: Stable/uncomplicated  EVALUATION COMPLEXITY: Low   GOALS: Goals reviewed with patient? Yes  SHORT TERM GOALS: Target date: 2 weeks 10/19/22  I HEP Baseline:initiated at eval Goal status: INITIAL  LONG TERM GOALS: Target date: 11/30/22  Oswestry improve to 30% disability Baseline: 40% Goal status: INITIAL  2.  Improve lumbar ROM for forward bending to wnl without pain provocation spine Baseline: 50% with pain Goal status: INITIAL  3.  Improve Sx B Ue's, reduce frequency of B UE paresthesia to 0% Baseline: occurs with driving, activities using B hands Goal status: INITIAL  PLAN:  PT FREQUENCY: 1-2x/week  PT DURATION: 8 weeks  PLANNED INTERVENTIONS: Therapeutic exercises, Therapeutic activity, Neuromuscular re-education, Balance training, Gait training, Patient/Family education, Self Care, and Joint mobilization.  PLAN FOR NEXT SESSION: additional therx, eval for dry needling, mobilization lumbar and thoracic    Michael Carter, PT 10/07/2022, 5:02 PM

## 2022-10-12 ENCOUNTER — Other Ambulatory Visit: Payer: Self-pay

## 2022-10-12 ENCOUNTER — Ambulatory Visit: Payer: No Typology Code available for payment source

## 2022-10-12 DIAGNOSIS — M549 Dorsalgia, unspecified: Secondary | ICD-10-CM | POA: Diagnosis not present

## 2022-10-12 DIAGNOSIS — M2569 Stiffness of other specified joint, not elsewhere classified: Secondary | ICD-10-CM

## 2022-10-12 DIAGNOSIS — R6889 Other general symptoms and signs: Secondary | ICD-10-CM

## 2022-10-12 NOTE — Therapy (Signed)
OUTPATIENT PHYSICAL THERAPY THORACOLUMBAR TREATMENT   Patient Name: Michael Carter MRN: [id number] DOB:March 24, 1986, 37 y.o., male Today's Date: 10/12/2022  END OF SESSION:  PT End of Session - 10/12/22 0904     Visit Number 3    Date for PT Re-Evaluation 11/30/22    Authorization Type [facility]    Progress Note Due on Visit 10    PT Start Time 0845    PT Stop Time 0930    PT Time Calculation (min) 45 min    Activity Tolerance Patient tolerated treatment well    Behavior During Therapy WFL for tasks assessed/performed              Past Medical History:  Diagnosis Date   Acid reflux    Asthma    Chronic pain    CKD (chronic kidney disease)    DDD (degenerative disc disease), lumbar    Headache    Liver disease    OSA (obstructive sleep apnea)    Plantar fasciitis    Vitamin D deficiency    Past Surgical History:  Procedure Laterality Date   NO PAST SURGERIES     Patient Active Problem List   Diagnosis Date Noted   Migraine without aura and without status migrainosus, not intractable 04/01/2020    PCP: Knox Royalty  REFERRING PROVIDER: Su Hoff  REFERRING DIAG: myofscial pain syndrome, midline back pain  Rationale for Evaluation and Treatment: Rehabilitation  THERAPY DIAG:  Mid back pain  Back stiffness  Impaired tolerance of activity  ONSET DATE: 3 months ago  SUBJECTIVE:                                                                                                                                                                                           SUBJECTIVE STATEMENT: reports improved numbness and tingling B hands continues, lower back very sore yesterday, obtained a large ball, foam roller, used a lot on Saturday, laid on roller and performed LTR at same time, then very stiff the next day.    PERTINENT HISTORY:  December was hurt at work when shirt was caught in conveyer belt, which pulled him in partially and he had to forcibly push himself  out of the machine backwards.  MD has recommended injections.  B knee pain chronic. States that referring MD wants him to participate in aquatic PT but   PAIN:  Are you having pain? Yes: NPRS scale: 10/10 Pain location: lower back radiating up to cervico thoracic jxn Pain description: intermittent, very sharp Aggravating factors: certain reaching activities sometimes when reaching Relieving factors: pain lasts several hours when it occurs.  Takes  tramadol with flare ups  PRECAUTIONS: None  WEIGHT BEARING RESTRICTIONS: No  FALLS:  Has patient fallen in last 6 months? No  LIVING ENVIRONMENT: Lives with: lives with their family, lives with their partner, and lives with their son Lives in: House/apartment Stairs: Yes: Internal: 4 steps; can reach both Has following equipment at home: None  OCCUPATION: runs a roll foil machine, lots of standing and walking, 8 hr shifts ,3rd  PLOF: Independent  PATIENT GOALS: get rid of pain back , and be able to lift, move better   NEXT MD VISIT: 4 weeks  OBJECTIVE:   DIAGNOSTIC FINDINGS:  Had LS x rays and MRI but not available today  PATIENT SURVEYS:  Modified Oswestry 20/50 or 40%   SCREENING FOR RED FLAGS: Bowel or bladder incontinence: No Spinal tumors: No Cauda equina syndrome: No Compression fracture: No Abdominal aneurysm: No  COGNITION: Overall cognitive status: Within functional limits for tasks assessed     SENSATION: Reports B hand numbness with prolonged shoulder elevation/ I.e., driving  MUSCLE LENGTH: Hamstrings: Right -40 deg; Left -40 de  POSTURE: L iliac crest high, L shoulder low, flattened LS spine, loss of gluteal mass,  L side bending with forward flexion standing  PALPATION: PA glides thoraco lumbar junciton and post ribs 6 to 9 B tender  LUMBAR ROM:   AROM eval  Flexion 50%  Extension wnl  Right lateral flexion 75%  Left lateral flexion 50%  Right rotation   Left rotation      LOWER EXTREMITY  ROM:   all LE ROM WNL    LOWER EXTREMITY MMT:  all MMT LE's wfl B  LUMBAR SPECIAL TESTS:  Straight leg raise test: Negative and FABER test: Negative  GAIT: Distance walked: over 100' in the clinic without deficits   TODAY'S TREATMENT:                                                                                                                              DATE:  10/12/22: Therapeutic exercise: Seated for lumbar and thoracic stretch with 65cm ball, 5 reps, 10 sec holds, forward Supine for B knee to chest stretch, feet on ball Supine for B LTR feet on ball  L side lying for one bout of muscle energy technique to address restricted L lumbar flexion, rot, side bending 5 sec bouts  Lat rows 45#, 1 set 20 Seated rows 45 # 1 set 20  Nustep, level 4, 6 min, Ue's and Les's  Supine thoracic spine stretch over 1/2 roll, roll parallel to spine for pecs and postural stretch 10/07/22: Manual:  prone for PA HVLA mobs mid and lower thoracic spine PA glides B post ribs mid and lower L side lying for muscle energy to isolate flex, rot. SB stretch lower L lumbar spine one set 8 reps, 5 sec holds  Therex:  lat rows 45#, 2 sets 15 reps  B shoulder rows 45# 2 sets 15 reps Supine  with 65 cm ball under legs, for B knee to chest, LTR No new ex added for home.  10/05/22:  Manual:  L side lying for muscle energy to improve L lumbar flexion/rot/SB restriction, 5 reps, 5 sec holds.  Reassessed LS forward bending in standing immediately afterward and improved motion noted  Therex: instructed in standing lumbar flexion R rotation and R side bending stretch with R hands on R lateral lower leg, and R foot on 4" step stool, to gap L lumbar spine  Also standing in door way for pec stretch, cues to maintain shoulders at 90 degrees abd.     PATIENT EDUCATION:  Education details: POC, goals Person educated: Patient Education method: Explanation, Demonstration, Tactile cues, and Verbal cues Education  comprehension: verbalized understanding  HOME EXERCISE PROGRAM: None printed, instructed as above  ASSESSMENT:  CLINICAL IMPRESSION: Patient is a 37 y.o. male who was seen today for physical therapy treatment for midline back pain. Rechecked lumbar flexion and  demonstrated increased lumbar flexion ROM as compared to initial eval.  Responded well during session today to both manual techniques and therex. Advanced the intensity of the therex.Will continue with additional lumbar flexion ex and thoracic spine extension stretching/ strengthening ex in future visits, to eventually begin aquatic PT OBJECTIVE IMPAIRMENTS: decreased activity tolerance, decreased mobility, difficulty walking, decreased ROM, postural dysfunction, and pain.   ACTIVITY LIMITATIONS: lifting, bending, sitting, and locomotion level  PARTICIPATION LIMITATIONS: driving, community activity, and yard work  PERSONAL FACTORS: Fitness and 1-2 comorbidities: chronic DJD knees, obesity  are also affecting patient's functional outcome.   REHAB POTENTIAL: Good  CLINICAL DECISION MAKING: Stable/uncomplicated  EVALUATION COMPLEXITY: Low   GOALS: Goals reviewed with patient? Yes  SHORT TERM GOALS: Target date: 2 weeks 10/19/22  I HEP Baseline:initiated at eval Goal status: INITIAL  LONG TERM GOALS: Target date: 11/30/22  Oswestry improve to 30% disability Baseline: 40% Goal status: IN PROGRESS  2.  Improve lumbar ROM for forward bending to wnl without pain provocation spine Baseline: 50% with pain Goal status: IN PROGRESS  3.  Improve Sx B Ue's, reduce frequency of B UE paresthesia to 0% Baseline: occurs with driving, activities using B hands Goal status: IN PROGRESS  PLAN:  PT FREQUENCY: 1-2x/week  PT DURATION: 8 weeks  PLANNED INTERVENTIONS: Therapeutic exercises, Therapeutic activity, Neuromuscular re-education, Balance training, Gait training, Patient/Family education, Self Care, and Joint  mobilization.  PLAN FOR NEXT SESSION: additional therx, eval for dry needling, mobilization lumbar and thoracic    Naomia Lenderman L Wynelle Dreier, PT 10/12/2022, 10:55 AM

## 2022-10-14 ENCOUNTER — Ambulatory Visit: Payer: No Typology Code available for payment source

## 2022-10-14 ENCOUNTER — Other Ambulatory Visit: Payer: Self-pay

## 2022-10-14 DIAGNOSIS — M2569 Stiffness of other specified joint, not elsewhere classified: Secondary | ICD-10-CM

## 2022-10-14 DIAGNOSIS — M549 Dorsalgia, unspecified: Secondary | ICD-10-CM | POA: Diagnosis not present

## 2022-10-14 DIAGNOSIS — R6889 Other general symptoms and signs: Secondary | ICD-10-CM

## 2022-10-14 NOTE — Therapy (Signed)
OUTPATIENT PHYSICAL THERAPY THORACOLUMBAR TREATMENT   Patient Name: Michael Carter MRN: [id number] DOB:1986-05-02, 37 y.o., male Today's Date: 10/14/2022  END OF SESSION:  PT End of Session - 10/14/22 0855     Visit Number 4    Date for PT Re-Evaluation 11/30/22    Authorization Type [facility]    PT Start Time 0848    PT Stop Time 0930    PT Time Calculation (min) 42 min    Activity Tolerance Patient tolerated treatment well    Behavior During Therapy WFL for tasks assessed/performed              Past Medical History:  Diagnosis Date   Acid reflux    Asthma    Chronic pain    CKD (chronic kidney disease)    DDD (degenerative disc disease), lumbar    Headache    Liver disease    OSA (obstructive sleep apnea)    Plantar fasciitis    Vitamin D deficiency    Past Surgical History:  Procedure Laterality Date   NO PAST SURGERIES     Patient Active Problem List   Diagnosis Date Noted   Migraine without aura and without status migrainosus, not intractable 04/01/2020    PCP: Knox Royalty  REFERRING PROVIDER: Su Hoff  REFERRING DIAG: myofscial pain syndrome, midline back pain  Rationale for Evaluation and Treatment: Rehabilitation  THERAPY DIAG:  Mid back pain  Back stiffness  Impaired tolerance of activity  ONSET DATE: 3 months ago  SUBJECTIVE:                                                                                                                                                                                           SUBJECTIVE STATEMENT: Still improved  with mid thoracic spine and numbness B hands.  Lower back seems to be improving as well . R ankle hurt all night at work PERTINENT HISTORY:  December was hurt at work when shirt was caught in [employer], which pulled him in partially and he had to forcibly push himself out of the machine backwards.  MD has recommended injections.  B knee pain chronic. States that referring MD wants him to  participate in aquatic PT but   PAIN:  Are you having pain? Yes: NPRS scale: 10/10 Pain location: lower back radiating up to cervico thoracic jxn Pain description: intermittent, very sharp Aggravating factors: certain reaching activities sometimes when reaching Relieving factors: pain lasts several hours when it occurs.  Takes tramadol with flare ups  PRECAUTIONS: None  WEIGHT BEARING RESTRICTIONS: No  FALLS:  Has patient fallen in last 6 months? No  LIVING ENVIRONMENT: Lives with: lives with their family, lives with their partner, and lives with their son Lives in: House/apartment Stairs: Yes: Internal: 4 steps; can reach both Has following equipment at home: None  OCCUPATION: runs a roll foil machine, lots of standing and walking, 8 hr shifts ,3rd  PLOF: Independent  PATIENT GOALS: get rid of pain back , and be able to lift, move better   NEXT MD VISIT: 4 weeks  OBJECTIVE:   DIAGNOSTIC FINDINGS:  Had LS x rays and MRI but not available today  PATIENT SURVEYS:  Modified Oswestry 20/50 or 40%   SCREENING FOR RED FLAGS: Bowel or bladder incontinence: No Spinal tumors: No Cauda equina syndrome: No Compression fracture: No Abdominal aneurysm: No  COGNITION: Overall cognitive status: Within functional limits for tasks assessed     SENSATION: Reports B hand numbness with prolonged shoulder elevation/ I.e., driving  MUSCLE LENGTH: Hamstrings: Right -40 deg; Left -40 de  POSTURE: L iliac crest high, L shoulder low, flattened LS spine, loss of gluteal mass,  L side bending with forward flexion standing  PALPATION: PA glides thoraco lumbar junciton and post ribs 6 to 9 B tender  LUMBAR ROM:   AROM eval  Flexion 50%  Extension wnl  Right lateral flexion 75%  Left lateral flexion 50%  Right rotation   Left rotation      LOWER EXTREMITY ROM:   all LE ROM WNL    LOWER EXTREMITY MMT:  all MMT LE's wfl B  LUMBAR SPECIAL TESTS:  Straight leg raise test:  Negative and FABER test: Negative  GAIT: Distance walked: over 100' in the clinic without deficits   TODAY'S TREATMENT:                                                                                                                              DATE:  10/14/22: Therapeutic exercise: lat rows 45#, 2 sets 15 reps  B shoulder rows 45# 2 sets 15 reps  Nustep level 5, UE's and LE's , 7 min for tissue perfusion, endurance , recumbent to avoid compressive forces B knees   Leg press, 40# 3 sets 15, advised to press more through heels to isolate post musculature , gluts  Seated large blue therapeutic ball for stretching for/ side/ rotation Supine large ball B knee to chest, LTR   10/12/22: Therapeutic exercise: Seated for lumbar and thoracic stretch with 65cm ball, 5 reps, 10 sec holds, forward Supine for B knee to chest stretch, feet on ball Supine for B LTR feet on ball  L side lying for one bout of muscle energy technique to address restricted L lumbar flexion, rot, side bending 5 sec bouts  Nustep level 4 Ue's and LE's 5 min  Seated lat rows 45#, 2 sets 15 reps  B shoulder rows 45# 2 sets 15 reps  Supine thoracic spine stretch over 1/2 roll, roll parallel to spine for pecs and postural stretch 10/07/22: Manual:  prone for PA HVLA mobs mid and lower thoracic spine PA glides B post ribs mid and lower L side lying for muscle energy to isolate flex, rot. SB stretch lower L lumbar spine one set 8 reps, 5 sec holds  Therex:  lat rows 45#, 2 sets 15 reps  B shoulder rows 45# 2 sets 15 reps Supine with 65 cm ball under legs, for B knee to chest, LTR No new ex added for home.  10/05/22:  Manual:  L side lying for muscle energy to improve L lumbar flexion/rot/SB restriction, 5 reps, 5 sec holds.  Reassessed LS forward bending in standing immediately afterward and improved motion noted  Therex: instructed in standing lumbar flexion R rotation and R side bending stretch with R hands on R  lateral lower leg, and R foot on 4" step stool, to gap L lumbar spine  Also standing in door way for pec stretch, cues to maintain shoulders at 90 degrees abd.     PATIENT EDUCATION:  Education details: POC, goals Person educated: Patient Education method: Explanation, Demonstration, Tactile cues, and Verbal cues Education comprehension: verbalized understanding  HOME EXERCISE PROGRAM: None printed, instructed as above  ASSESSMENT:  CLINICAL IMPRESSION: Patient is a 37 y.o. male who was seen today for physical therapy treatment for midline back pain. Marland Kitchen  Responded well during session today to therex. No manual today, he is improveing with his lumbar flexibility and rating of pain subjectively. Advanced the intensity of the therex.Will continue with additional lumbar flexion ex and thoracic spine extension stretching/ strengthening ex in future visits, to eventually begin aquatic PT  OBJECTIVE IMPAIRMENTS: decreased activity tolerance, decreased mobility, difficulty walking, decreased ROM, postural dysfunction, and pain.   ACTIVITY LIMITATIONS: lifting, bending, sitting, and locomotion level  PARTICIPATION LIMITATIONS: driving, community activity, and yard work  PERSONAL FACTORS: Fitness and 1-2 comorbidities: chronic DJD knees, obesity  are also affecting patient's functional outcome.   REHAB POTENTIAL: Good  CLINICAL DECISION MAKING: Stable/uncomplicated  EVALUATION COMPLEXITY: Low   GOALS: Goals reviewed with patient? Yes  SHORT TERM GOALS: Target date: 2 weeks 10/19/22  I HEP Baseline:initiated at eval Goal status: INITIAL  LONG TERM GOALS: Target date: 11/30/22  Oswestry improve to 30% disability Baseline: 40% Goal status: IN PROGRESS  2.  Improve lumbar ROM for forward bending to wnl without pain provocation spine Baseline: 50% with pain Goal status: IN PROGRESS  3.  Improve Sx B Ue's, reduce frequency of B UE paresthesia to 0% Baseline: occurs with driving,  activities using B hands Goal status: IN PROGRESS  PLAN:  PT FREQUENCY: 1-2x/week  PT DURATION: 8 weeks  PLANNED INTERVENTIONS: Therapeutic exercises, Therapeutic activity, Neuromuscular re-education, Balance training, Gait training, Patient/Family education, Self Care, and Joint mobilization.  PLAN FOR NEXT SESSION: additional therx, eval for dry needling, mobilization lumbar and thoracic    Gabreil Yonkers L Edelmiro Innocent, PT 10/14/2022, 6:17 PM

## 2022-10-19 ENCOUNTER — Ambulatory Visit: Payer: No Typology Code available for payment source

## 2022-10-19 DIAGNOSIS — M549 Dorsalgia, unspecified: Secondary | ICD-10-CM | POA: Diagnosis not present

## 2022-10-19 DIAGNOSIS — M2569 Stiffness of other specified joint, not elsewhere classified: Secondary | ICD-10-CM

## 2022-10-19 DIAGNOSIS — R6889 Other general symptoms and signs: Secondary | ICD-10-CM

## 2022-10-19 NOTE — Therapy (Signed)
OUTPATIENT PHYSICAL THERAPY THORACOLUMBAR TREATMENT   Patient Name: Michael Carter MRN: [id number] DOB:1985/09/28, 37 y.o., male Today's Date: 10/19/2022  END OF SESSION:  PT End of Session - 10/19/22 1619     Visit Number 5    Date for PT Re-Evaluation 11/30/22    Authorization Type [facility]    PT Start Time 1616    PT Stop Time 1645    PT Time Calculation (min) 29 min              Past Medical History:  Diagnosis Date   Acid reflux    Asthma    Chronic pain    CKD (chronic kidney disease)    DDD (degenerative disc disease), lumbar    Headache    Liver disease    OSA (obstructive sleep apnea)    Plantar fasciitis    Vitamin D deficiency    Past Surgical History:  Procedure Laterality Date   NO PAST SURGERIES     Patient Active Problem List   Diagnosis Date Noted   Migraine without aura and without status migrainosus, not intractable 04/01/2020    PCP: Knox Royalty  REFERRING PROVIDER: Su Hoff  REFERRING DIAG: myofscial pain syndrome, midline back pain  Rationale for Evaluation and Treatment: Rehabilitation  THERAPY DIAG:  Mid back pain  Back stiffness  Impaired tolerance of activity  ONSET DATE: 3 months ago  SUBJECTIVE:                                                                                                                                                                                           SUBJECTIVE STATEMENT: Still improved  with mid thoracic spine and numbness B hands.  Lower back seems to be improving as well . R ankle hurt all night at work PERTINENT HISTORY:  December was hurt at work when shirt was caught in [employer], which pulled him in partially and he had to forcibly push himself out of the machine backwards.  MD has recommended injections.  B knee pain chronic. States that referring MD wants him to participate in aquatic PT but   PAIN:  Are you having pain? Yes: NPRS scale: 10/10 Pain location: lower back radiating  up to cervico thoracic jxn Pain description: intermittent, very sharp Aggravating factors: certain reaching activities sometimes when reaching Relieving factors: pain lasts several hours when it occurs.  Takes tramadol with flare ups  PRECAUTIONS: None  WEIGHT BEARING RESTRICTIONS: No  FALLS:  Has patient fallen in last 6 months? No  LIVING ENVIRONMENT: Lives with: lives with their family, lives with their partner, and lives with their son Lives  in: House/apartment Stairs: Yes: Internal: 4 steps; can reach both Has following equipment at home: None  OCCUPATION: runs a roll foil machine, lots of standing and walking, 8 hr shifts ,3rd  PLOF: Independent  PATIENT GOALS: get rid of pain back , and be able to lift, move better   NEXT MD VISIT: 4 weeks  OBJECTIVE:   DIAGNOSTIC FINDINGS:  Had LS x rays and MRI but not available today  PATIENT SURVEYS:  Modified Oswestry 20/50 or 40%   SCREENING FOR RED FLAGS: Bowel or bladder incontinence: No Spinal tumors: No Cauda equina syndrome: No Compression fracture: No Abdominal aneurysm: No  COGNITION: Overall cognitive status: Within functional limits for tasks assessed     SENSATION: Reports B hand numbness with prolonged shoulder elevation/ I.e., driving  MUSCLE LENGTH: Hamstrings: Right -40 deg; Left -40 de  POSTURE: L iliac crest high, L shoulder low, flattened LS spine, loss of gluteal mass,  L side bending with forward flexion standing  PALPATION: PA glides thoraco lumbar junciton and post ribs 6 to 9 B tender  LUMBAR ROM:   AROM eval  Flexion 50%  Extension wnl  Right lateral flexion 75%  Left lateral flexion 50%  Right rotation   Left rotation      LOWER EXTREMITY ROM:   all LE ROM WNL    LOWER EXTREMITY MMT:  all MMT LE's wfl B  LUMBAR SPECIAL TESTS:  Straight leg raise test: Negative and FABER test: Negative  GAIT: Distance walked: over 100' in the clinic without deficits   TODAY'S TREATMENT:                                                                                                                               DATE:  10/19/22: Manual:  HVLA C6/7 Supine contract/relax upper cervical spine for R rotation with improvement noted  Therapeutic exercise:   Seated rows 45#, 2 sets 20x B LE leg press, 40#, 15 x 3 Black metal bar for B calf stretch and heel raises. Seated stretch with blue ball, forward B shoulder flexion , thoracic extension stretch.  10/14/22: Therapeutic exercise: Nustep level 6, 5 min, Ue's and LE's  lat rows 45#, 2 sets 15 reps  B shoulder rows 45# 2 sets 15 reps  Nustep level 5, UE's and LE's , 7 min for tissue perfusion, endurance , recumbent to avoid compressive forces B knees   Leg press, 40# 3 sets 15, advised to press more through heels to isolate post musculature , gluts  Seated large blue therapeutic ball for stretching for/ side/ rotation Supine large ball B knee to chest, LTR   10/12/22: Therapeutic exercise: Seated for lumbar and thoracic stretch with 65cm ball, 5 reps, 10 sec holds, forward Supine for B knee to chest stretch, feet on ball Supine for B LTR feet on ball  L side lying for one bout of muscle energy technique to address restricted L lumbar flexion, rot, side  bending 5 sec bouts  Nustep level 4 Ue's and LE's 5 min  Seated lat rows 45#, 2 sets 15 reps  B shoulder rows 45# 2 sets 15 reps  Supine thoracic spine stretch over 1/2 roll, roll parallel to spine for pecs and postural stretch 10/07/22: Manual:  prone for PA HVLA mobs mid and lower thoracic spine PA glides B post ribs mid and lower L side lying for muscle energy to isolate flex, rot. SB stretch lower L lumbar spine one set 8 reps, 5 sec holds  Therex:  lat rows 45#, 2 sets 15 reps  B shoulder rows 45# 2 sets 15 reps Supine with 65 cm ball under legs, for B knee to chest, LTR No new ex added for home.  10/05/22:  Manual:  L side lying for muscle energy to improve L  lumbar flexion/rot/SB restriction, 5 reps, 5 sec holds.  Reassessed LS forward bending in standing immediately afterward and improved motion noted  Therex: instructed in standing lumbar flexion R rotation and R side bending stretch with R hands on R lateral lower leg, and R foot on 4" step stool, to gap L lumbar spine  Also standing in door way for pec stretch, cues to maintain shoulders at 90 degrees abd.     PATIENT EDUCATION:  Education details: POC, goals Person educated: Patient Education method: Explanation, Demonstration, Tactile cues, and Verbal cues Education comprehension: verbalized understanding  HOME EXERCISE PROGRAM: None printed, instructed as above  ASSESSMENT:  CLINICAL IMPRESSION: Patient is a 37 y.o. male who was seen today for physical therapy treatment for midline back pain. .  Abbreviated appt today as 15 min late.  Continues to respond well to manual techniques and therex.  OBJECTIVE IMPAIRMENTS: decreased activity tolerance, decreased mobility, difficulty walking, decreased ROM, postural dysfunction, and pain.   ACTIVITY LIMITATIONS: lifting, bending, sitting, and locomotion level  PARTICIPATION LIMITATIONS: driving, community activity, and yard work  PERSONAL FACTORS: Fitness and 1-2 comorbidities: chronic DJD knees, obesity  are also affecting patient's functional outcome.   REHAB POTENTIAL: Good  CLINICAL DECISION MAKING: Stable/uncomplicated  EVALUATION COMPLEXITY: Low   GOALS: Goals reviewed with patient? Yes  SHORT TERM GOALS: Target date: 2 weeks 10/19/22  I HEP Baseline:initiated at eval Goal status: INITIAL  LONG TERM GOALS: Target date: 11/30/22  Oswestry improve to 30% disability Baseline: 40% Goal status: IN PROGRESS  2.  Improve lumbar ROM for forward bending to wnl without pain provocation spine Baseline: 50% with pain Goal status: IN PROGRESS  3.  Improve Sx B Ue's, reduce frequency of B UE paresthesia to 0% Baseline: occurs  with driving, activities using B hands Goal status: IN PROGRESS  PLAN:  PT FREQUENCY: 1-2x/week  PT DURATION: 8 weeks  PLANNED INTERVENTIONS: Therapeutic exercises, Therapeutic activity, Neuromuscular re-education, Balance training, Gait training, Patient/Family education, Self Care, and Joint mobilization.  PLAN FOR NEXT SESSION: additional therx, mobilization lumbar and thoracic    Grisel Blumenstock L Mily Malecki, PT 10/19/2022, 4:54 PM

## 2022-10-21 ENCOUNTER — Encounter: Payer: Self-pay | Admitting: Certified Registered Nurse Anesthetist

## 2022-10-22 ENCOUNTER — Telehealth: Payer: Self-pay | Admitting: Gastroenterology

## 2022-10-22 ENCOUNTER — Encounter: Payer: Self-pay | Admitting: Gastroenterology

## 2022-10-22 ENCOUNTER — Ambulatory Visit: Payer: No Typology Code available for payment source | Admitting: Gastroenterology

## 2022-10-22 ENCOUNTER — Ambulatory Visit: Payer: No Typology Code available for payment source | Admitting: Physical Therapy

## 2022-10-22 VITALS — BP 121/85 | HR 65 | Temp 97.8°F | Resp 7 | Ht 67.5 in | Wt 306.6 lb

## 2022-10-22 DIAGNOSIS — D124 Benign neoplasm of descending colon: Secondary | ICD-10-CM

## 2022-10-22 DIAGNOSIS — D123 Benign neoplasm of transverse colon: Secondary | ICD-10-CM

## 2022-10-22 DIAGNOSIS — R197 Diarrhea, unspecified: Secondary | ICD-10-CM

## 2022-10-22 DIAGNOSIS — K2100 Gastro-esophageal reflux disease with esophagitis, without bleeding: Secondary | ICD-10-CM

## 2022-10-22 DIAGNOSIS — K2950 Unspecified chronic gastritis without bleeding: Secondary | ICD-10-CM | POA: Diagnosis not present

## 2022-10-22 DIAGNOSIS — K219 Gastro-esophageal reflux disease without esophagitis: Secondary | ICD-10-CM

## 2022-10-22 DIAGNOSIS — R194 Change in bowel habit: Secondary | ICD-10-CM

## 2022-10-22 MED ORDER — SODIUM CHLORIDE 0.9 % IV SOLN
500.0000 mL | Freq: Once | INTRAVENOUS | Status: AC
Start: 2022-10-22 — End: ?

## 2022-10-22 MED ORDER — OMEPRAZOLE 40 MG PO CPDR
40.0000 mg | DELAYED_RELEASE_CAPSULE | Freq: Every day | ORAL | 3 refills | Status: AC
Start: 2022-10-22 — End: ?

## 2022-10-22 NOTE — Progress Notes (Signed)
See 10/06/2022 H&P, changes

## 2022-10-22 NOTE — Progress Notes (Signed)
Called to room to assist during endoscopic procedure.  Patient ID and intended procedure confirmed with present staff. Received instructions for my participation in the procedure from the performing physician.  

## 2022-10-22 NOTE — Progress Notes (Signed)
Report given to PACU, vss 

## 2022-10-22 NOTE — Op Note (Signed)
[facility] Patient Name: Michael Carter Procedure Date: 10/22/2022 1:12 PM MRN: [id number] Endoscopist: Meryl Dare , MD, [phone] Age: 37 Referring MD:  Date of Birth: 05/18/1986 Gender: Male Account #: [id number] Procedure:                Colonoscopy Indications:              Clinically significant diarrhea of unexplained                            origin, Change in bowel habits Medicines:                Monitored Anesthesia Care Procedure:                Pre-Anesthesia Assessment:                           - Prior to the procedure, a History and Physical                            was performed, and patient medications and                            allergies were reviewed. The patient's tolerance of                            previous anesthesia was also reviewed. The risks                            and benefits of the procedure and the sedation                            options and risks were discussed with the patient.                            All questions were answered, and informed consent                            was obtained. Prior Anticoagulants: The patient has                            taken no anticoagulant or antiplatelet agents. ASA                            Grade Assessment: III - A patient with severe                            systemic disease. After reviewing the risks and                            benefits, the patient was deemed in satisfactory                            condition to undergo the procedure.  After obtaining informed consent, the colonoscope                            was passed under direct vision. Throughout the                            procedure, the patient's blood pressure, pulse, and                            oxygen saturations were monitored continuously. The                            Olympus SN [id number] was introduced through the anus                            and advanced to the the  terminal ileum, with                            identification of the appendiceal orifice and IC                            valve. The ileocecal valve, appendiceal orifice,                            and rectum were photographed. The quality of the                            bowel preparation was good. The colonoscopy was                            performed without difficulty. The patient tolerated                            the procedure well. Scope In: 1:33:45 PM Scope Out: 1:49:08 PM Scope Withdrawal Time: 0 hours 13 minutes 8 seconds  Total Procedure Duration: 0 hours 15 minutes 23 seconds  Findings:                 The perianal and digital rectal examinations were                            normal.                           The terminal ileum appeared normal.                           Two sessile polyps were found in the descending                            colon and transverse colon. The polyps were 7 mm in                            size. These polyps were removed with a cold snare.  Resection and retrieval were complete.                           Internal hemorrhoids were found during                            retroflexion. The hemorrhoids were small and Grade                            I (internal hemorrhoids that do not prolapse).                           The exam was otherwise without abnormality on                            direct and retroflexion views. Biopsies obtained                            throughtout. Complications:            No immediate complications. Estimated blood loss:                            None. Estimated Blood Loss:     Estimated blood loss: none. Impression:               - Two 7 mm polyps in the descending colon and in                            the transverse colon, removed with a cold snare.                            Resected and retrieved.                           - Normal terminal ileum.                            - Internal hemorrhoids.                           - The examination was otherwise normal on direct                            and retroflexion views. Biopsied. Recommendation:           - Repeat colonoscopy after studies are complete for                            surveillance based on pathology results.                           - Patient has a contact number available for                            emergencies. The signs and symptoms of potential  delayed complications were discussed with the                            patient. Return to normal activities tomorrow.                            Written discharge instructions were provided to the                            patient.                           - Resume previous diet.                           - Continue present medications.                           - Await pathology results. Meryl Dare, MD 10/22/2022 1:52:18 PM This report has been signed electronically.

## 2022-10-22 NOTE — Patient Instructions (Addendum)
Resume previous diet. Continue present medications. Await pathology results. Follow antireflux measures long term. Omeprazole 40 mg po daily, 1 year of refills                           YOU HAD AN ENDOSCOPIC PROCEDURE TODAY AT THE [facility]:   Refer to the procedure report that was given to you for any specific questions about what was found during the examination.  If the procedure report does not answer your questions, please call your gastroenterologist to clarify.  If you requested that your care partner not be given the details of your procedure findings, then the procedure report has been included in a sealed envelope for you to review at your convenience later.  YOU SHOULD EXPECT: Some feelings of bloating in the abdomen. Passage of more gas than usual.  Walking can help get rid of the air that was put into your GI tract during the procedure and reduce the bloating. If you had a lower endoscopy (such as a colonoscopy or flexible sigmoidoscopy) you may notice spotting of blood in your stool or on the toilet paper. If you underwent a bowel prep for your procedure, you may not have a normal bowel movement for a few days.  Please Note:  You might notice some irritation and congestion in your nose or some drainage.  This is from the oxygen used during your procedure.  There is no need for concern and it should clear up in a day or so.  SYMPTOMS TO REPORT IMMEDIATELY:  Following lower endoscopy (colonoscopy or flexible sigmoidoscopy):  Excessive amounts of blood in the stool  Significant tenderness or worsening of abdominal pains  Swelling of the abdomen that is new, acute  Fever of 100F or higher  Following upper endoscopy (EGD)  Vomiting of blood or coffee ground material  New chest pain or pain under the shoulder blades  Painful or persistently difficult swallowing  New shortness of breath  Fever of 100F or higher  Black, tarry-looking stools  For urgent or emergent  issues, a gastroenterologist can be reached at any hour by calling [phone]. Do not use MyChart messaging for urgent concerns.    DIET:  We do recommend a small meal at first, but then you may proceed to your regular diet.  Drink plenty of fluids but you should avoid alcoholic beverages for 24 hours.  ACTIVITY:  You should plan to take it easy for the rest of today and you should NOT DRIVE or use heavy machinery until tomorrow (because of the sedation medicines used during the test).    FOLLOW UP: Our staff will call the number listed on your records the next business day following your procedure.  We will call around 7:15- 8:00 am to check on you and address any questions or concerns that you may have regarding the information given to you following your procedure. If we do not reach you, we will leave a message.     If any biopsies were taken you will be contacted by phone or by letter within the next 1-3 weeks.  Please call us at [phone] if you have not heard about the biopsies in 3 weeks.    SIGNATURES/CONFIDENTIALITY: You and/or your care partner have signed paperwork which will be entered into your electronic medical record.  These signatures attest to the fact that that the information above on your After Visit Summary has been reviewed and  is understood.  Full responsibility of the confidentiality of this discharge information lies with you and/or your care-partner.

## 2022-10-22 NOTE — Telephone Encounter (Signed)
PT called to confirm he will come in earlier for procedure

## 2022-10-22 NOTE — Op Note (Signed)
[facility] Patient Name: Michael Carter Procedure Date: 10/22/2022 1:11 PM MRN: [id number] Endoscopist: Meryl Dare , MD, [phone] Age: 37 Referring MD:  Date of Birth: May 24, 1986 Gender: Male Account #: [id number] Procedure:                Upper GI endoscopy Indications:              Gastro-esophageal reflux disease, Diarrhea Medicines:                Monitored Anesthesia Care Procedure:                Pre-Anesthesia Assessment:                           - Prior to the procedure, a History and Physical                            was performed, and patient medications and                            allergies were reviewed. The patient's tolerance of                            previous anesthesia was also reviewed. The risks                            and benefits of the procedure and the sedation                            options and risks were discussed with the patient.                            All questions were answered, and informed consent                            was obtained. Prior Anticoagulants: The patient has                            taken no anticoagulant or antiplatelet agents. ASA                            Grade Assessment: III - A patient with severe                            systemic disease. After reviewing the risks and                            benefits, the patient was deemed in satisfactory                            condition to undergo the procedure.                           After obtaining informed consent, the endoscope was  passed under direct vision. Throughout the                            procedure, the patient's blood pressure, pulse, and                            oxygen saturations were monitored continuously. The                            Olympus scope [id number] was introduced through the                            mouth, and advanced to the second part of duodenum.                            The  upper GI endoscopy was accomplished without                            difficulty. The patient tolerated the procedure                            well. Scope In: Scope Out: Findings:                 LA Grade B (one or more mucosal breaks greater than                            5 mm, not extending between the tops of two mucosal                            folds) esophagitis with no bleeding was found at                            the gastroesophageal junction.                           The exam of the esophagus was otherwise normal.                           Localized moderately erythematous mucosa without                            bleeding was found in the cardia. Biopsies were                            taken with a cold forceps for histology.                           A small hiatal hernia was present.                           The exam of the stomach was otherwise normal.                           The duodenal  bulb and second portion of the                            duodenum were normal. Biopsies for histology were                            taken with a cold forceps for evaluation of celiac                            disease. Complications:            No immediate complications. Estimated Blood Loss:     Estimated blood loss was minimal. Impression:               - LA Grade B reflux esophagitis with no bleeding.                           - Erythematous mucosa in the cardia. Biopsied.                           - Small hiatal hernia.                           - Normal duodenal bulb and second portion of the                            duodenum. Biopsied. Recommendation:           - Patient has a contact number available for                            emergencies. The signs and symptoms of potential                            delayed complications were discussed with the                            patient. Return to normal activities tomorrow.                            Written  discharge instructions were provided to the                            patient.                           - Resume previous diet.                           - Follow antireflux measures long term.                           - Continue present medications.                           - Omeprazole 40 mg po qd, 1 year of refills                           -  Await pathology results. Meryl Dare, MD 10/22/2022 2:16:25 PM This report has been signed electronically.

## 2022-10-22 NOTE — Progress Notes (Signed)
1329 Robinul 0.1 mg IV given due large amount of secretions upon assessment.  MD made aware, vss 

## 2022-10-22 NOTE — Progress Notes (Signed)
Pt's states no medical or surgical changes since previsit or office visit. 

## 2022-10-23 ENCOUNTER — Telehealth: Payer: Self-pay | Admitting: *Deleted

## 2022-10-23 NOTE — Telephone Encounter (Signed)
  Follow up Call-     10/22/2022    1:03 PM  Call back number  Post procedure Call Back phone  # [phone]  Permission to leave phone message Yes     Patient questions:  Do you have a fever, pain , or abdominal swelling? No. Pain Score  0 *  Have you tolerated food without any problems? Yes.    Have you been able to return to your normal activities? Yes.    Do you have any questions about your discharge instructions: Diet   No. Medications  No. Follow up visit  No.  Do you have questions or concerns about your Care? No.  Actions: * If pain score is 4 or above: No action needed, pain <4.

## 2022-10-26 ENCOUNTER — Ambulatory Visit: Payer: No Typology Code available for payment source | Admitting: Physical Therapy

## 2022-10-26 ENCOUNTER — Encounter: Payer: Self-pay | Admitting: Physical Therapy

## 2022-10-26 DIAGNOSIS — R6889 Other general symptoms and signs: Secondary | ICD-10-CM

## 2022-10-26 DIAGNOSIS — M549 Dorsalgia, unspecified: Secondary | ICD-10-CM | POA: Diagnosis not present

## 2022-10-26 DIAGNOSIS — M2569 Stiffness of other specified joint, not elsewhere classified: Secondary | ICD-10-CM

## 2022-10-26 NOTE — Therapy (Signed)
OUTPATIENT PHYSICAL THERAPY THORACOLUMBAR TREATMENT   Patient Name: Michael Carter MRN: [id number] DOB:February 17, 1986, 37 y.o., male Today's Date: 10/26/2022  END OF SESSION:  PT End of Session - 10/26/22 1623     Visit Number 6    Date for PT Re-Evaluation 11/30/22    Authorization Type [place]    Activity Tolerance Patient tolerated treatment well    Behavior During Therapy [facility] for tasks assessed/performed              Past Medical History:  Diagnosis Date   Acid reflux    Asthma    Chronic pain    CKD (chronic kidney disease)    DDD (degenerative disc disease), lumbar    Headache    Liver disease    OSA (obstructive sleep apnea)    Plantar fasciitis    Vitamin D deficiency    Past Surgical History:  Procedure Laterality Date   NO PAST SURGERIES     Patient Active Problem List   Diagnosis Date Noted   Migraine without aura and without status migrainosus, not intractable 04/01/2020    PCP: Knox Royalty  REFERRING PROVIDER: Su Hoff  REFERRING DIAG: myofscial pain syndrome, midline back pain  Rationale for Evaluation and Treatment: Rehabilitation  THERAPY DIAG:  Mid back pain  Back stiffness  Impaired tolerance of activity  ONSET DATE: 3 months ago  SUBJECTIVE:                                                                                                                                                                                           SUBJECTIVE STATEMENT: Hurting in the back and the neck Pain is 6/10 in the rhomboids and the neck.  Aquatic therapy the 7th PERTINENT HISTORY:  December was hurt at work when shirt was caught in [employer], which pulled him in partially and he had to forcibly push himself out of the machine backwards.  MD has recommended injections.  B knee pain chronic. States that referring MD wants him to participate in aquatic PT but   PAIN:  Are you having pain? Yes: NPRS scale: 10/10 Pain location: lower back radiating  up to cervico thoracic jxn Pain description: intermittent, very sharp Aggravating factors: certain reaching activities sometimes when reaching Relieving factors: pain lasts several hours when it occurs.  Takes tramadol with flare ups  PRECAUTIONS: None  WEIGHT BEARING RESTRICTIONS: No  FALLS:  Has patient fallen in last 6 months? No  LIVING ENVIRONMENT: Lives with: lives with their family, lives with their partner, and lives with their son Lives in: House/apartment Stairs: Yes: Internal: 4 steps; can reach both Has  following equipment at home: None  OCCUPATION: runs a roll foil machine, lots of standing and walking, 8 hr shifts ,3rd  PLOF: Independent  PATIENT GOALS: get rid of pain back , and be able to lift, move better   NEXT MD VISIT: 4 weeks  OBJECTIVE:   DIAGNOSTIC FINDINGS:  Had LS x rays and MRI but not available today  PATIENT SURVEYS:  Modified Oswestry 20/50 or 40%   SCREENING FOR RED FLAGS: Bowel or bladder incontinence: No Spinal tumors: No Cauda equina syndrome: No Compression fracture: No Abdominal aneurysm: No  COGNITION: Overall cognitive status: Within functional limits for tasks assessed     SENSATION: Reports B hand numbness with prolonged shoulder elevation/ I.e., driving  MUSCLE LENGTH: Hamstrings: Right -40 deg; Left -40 de  POSTURE: L iliac crest high, L shoulder low, flattened LS spine, loss of gluteal mass,  L side bending with forward flexion standing  PALPATION: PA glides thoraco lumbar junciton and post ribs 6 to 9 B tender  LUMBAR ROM:   AROM eval  Flexion 50%  Extension wnl  Right lateral flexion 75%  Left lateral flexion 50%  Right rotation   Left rotation      LOWER EXTREMITY ROM:   all LE ROM WNL    LOWER EXTREMITY MMT:  all MMT LE's wfl B  LUMBAR SPECIAL TESTS:  Straight leg raise test: Negative and FABER test: Negative  GAIT: Distance walked: over 100' in the clinic without deficits   TODAY'S TREATMENT:                                                                                                                               DATE:  10/26/22 Nustep level 5 x 5 minutes Feet on ball K2C, rotation, small bridge, iso abs Passive HS and piriformis stretches DN to the bilateral upper traps with good LTR's STM with the theragun to the upper traps the neck and the rhomboids Thoracic segmental mobility PA and rotational glides Education on some posture and body mechanics for his job, education on POC and his dx  10/19/22: Manual:  HVLA C6/7 Supine contract/relax upper cervical spine for R rotation with improvement noted  Therapeutic exercise:   Seated rows 45#, 2 sets 20x B LE leg press, 40#, 15 x 3 Black metal bar for B calf stretch and heel raises. Seated stretch with blue ball, forward B shoulder flexion , thoracic extension stretch.  10/14/22: Therapeutic exercise: Nustep level 6, 5 min, Ue's and LE's  lat rows 45#, 2 sets 15 reps  B shoulder rows 45# 2 sets 15 reps  Nustep level 5, UE's and LE's , 7 min for tissue perfusion, endurance , recumbent to avoid compressive forces B knees   Leg press, 40# 3 sets 15, advised to press more through heels to isolate post musculature , gluts  Seated large blue therapeutic ball for stretching for/ side/ rotation Supine large ball B knee to chest, LTR  10/12/22: Therapeutic exercise: Seated for lumbar and thoracic stretch with 65cm ball, 5 reps, 10 sec holds, forward Supine for B knee to chest stretch, feet on ball Supine for B LTR feet on ball  L side lying for one bout of muscle energy technique to address restricted L lumbar flexion, rot, side bending 5 sec bouts  Nustep level 4 Ue's and LE's 5 min  Seated lat rows 45#, 2 sets 15 reps  B shoulder rows 45# 2 sets 15 reps  Supine thoracic spine stretch over 1/2 roll, roll parallel to spine for pecs and postural stretch 10/07/22: Manual:  prone for PA HVLA mobs mid and lower thoracic  spine PA glides B post ribs mid and lower L side lying for muscle energy to isolate flex, rot. SB stretch lower L lumbar spine one set 8 reps, 5 sec holds  Therex:  lat rows 45#, 2 sets 15 reps  B shoulder rows 45# 2 sets 15 reps Supine with 65 cm ball under legs, for B knee to chest, LTR No new ex added for home.  10/05/22:  Manual:  L side lying for muscle energy to improve L lumbar flexion/rot/SB restriction, 5 reps, 5 sec holds.  Reassessed LS forward bending in standing immediately afterward and improved motion noted  Therex: instructed in standing lumbar flexion R rotation and R side bending stretch with R hands on R lateral lower leg, and R foot on 4" step stool, to gap L lumbar spine  Also standing in door way for pec stretch, cues to maintain shoulders at 90 degrees abd.     PATIENT EDUCATION:  Education details: POC, goals Person educated: Patient Education method: Explanation, Demonstration, Tactile cues, and Verbal cues Education comprehension: verbalized understanding  HOME EXERCISE PROGRAM: None printed, instructed as above  ASSESSMENT:  CLINICAL IMPRESSION: Patient is a 37 y.o. male who was seen today for physical therapy treatment for midline back pain. Marland Kitchenadded DN to the traps, some STM with the theragun, education on posture and body mechanics for his job and the dx he has and our POC to help, talked about possible traction  OBJECTIVE IMPAIRMENTS: decreased activity tolerance, decreased mobility, difficulty walking, decreased ROM, postural dysfunction, and pain.   ACTIVITY LIMITATIONS: lifting, bending, sitting, and locomotion level  PARTICIPATION LIMITATIONS: driving, community activity, and yard work  PERSONAL FACTORS: Fitness and 1-2 comorbidities: chronic DJD knees, obesity  are also affecting patient's functional outcome.   REHAB POTENTIAL: Good  CLINICAL DECISION MAKING: Stable/uncomplicated  EVALUATION COMPLEXITY: Low   GOALS: Goals reviewed with  patient? Yes  SHORT TERM GOALS: Target date: 2 weeks 10/19/22  I HEP Baseline:initiated at eval Goal status: met 10/26/22  LONG TERM GOALS: Target date: 11/30/22  Oswestry improve to 30% disability Baseline: 40% Goal status: IN PROGRESS  2.  Improve lumbar ROM for forward bending to wnl without pain provocation spine Baseline: 50% with pain Goal status: IN PROGRESS  3.  Improve Sx B Ue's, reduce frequency of B UE paresthesia to 0% Baseline: occurs with driving, activities using B hands Goal status: IN PROGRESS  PLAN:  PT FREQUENCY: 1-2x/week  PT DURATION: 8 weeks  PLANNED INTERVENTIONS: Therapeutic exercises, Therapeutic activity, Neuromuscular re-education, Balance training, Gait training, Patient/Family education, Self Care, and Joint mobilization.  PLAN FOR NEXT SESSION: additional therx, mobilization lumbar and thoracic , see how the DN did and see if we want to try traction   Jearld Lesch, PT 10/26/2022, 4:23 PM

## 2022-10-27 NOTE — Therapy (Signed)
OUTPATIENT PHYSICAL THERAPY THORACOLUMBAR TREATMENT   Patient Name: Michael Carter MRN: [id number] DOB:01/19/1986, 37 y.o., male Today's Date: 10/28/2022  END OF SESSION:  PT End of Session - 10/28/22 0800     Visit Number 7    Date for PT Re-Evaluation 11/30/22    Authorization Type [facility]    PT Start Time 0800    PT Stop Time 0845    PT Time Calculation (min) 45 min    Activity Tolerance Patient tolerated treatment well    Behavior During Therapy WFL for tasks assessed/performed               Past Medical History:  Diagnosis Date   Acid reflux    Asthma    Chronic pain    CKD (chronic kidney disease)    DDD (degenerative disc disease), lumbar    Headache    Liver disease    OSA (obstructive sleep apnea)    Plantar fasciitis    Vitamin D deficiency    Past Surgical History:  Procedure Laterality Date   NO PAST SURGERIES     Patient Active Problem List   Diagnosis Date Noted   Migraine without aura and without status migrainosus, not intractable 04/01/2020    PCP: Knox Royalty  REFERRING PROVIDER: Su Hoff  REFERRING DIAG: myofscial pain syndrome, midline back pain  Rationale for Evaluation and Treatment: Rehabilitation  THERAPY DIAG:  Mid back pain  Back stiffness  Impaired tolerance of activity  ONSET DATE: 3 months ago  SUBJECTIVE:                                                                                                                                                                                           SUBJECTIVE STATEMENT: I am here, pain is about a 5 or 6. The needles helped the neck some.    PERTINENT HISTORY:  December was hurt at work when shirt was caught in conveyer belt, which pulled him in partially and he had to forcibly push himself out of the machine backwards.  MD has recommended injections.  B knee pain chronic. States that referring MD wants him to participate in aquatic PT but   PAIN:  Are you having pain? Yes:  NPRS scale: 5/10 Pain location: lower back radiating up to cervico thoracic jxn Pain description: intermittent, very sharp Aggravating factors: certain reaching activities sometimes when reaching Relieving factors: pain lasts several hours when it occurs.  Takes tramadol with flare ups  PRECAUTIONS: None  WEIGHT BEARING RESTRICTIONS: No  FALLS:  Has patient fallen in last 6 months? No  LIVING ENVIRONMENT: Lives with: lives with their  family, lives with their partner, and lives with their son Lives in: House/apartment Stairs: Yes: Internal: 4 steps; can reach both Has following equipment at home: None  OCCUPATION: runs a roll foil machine, lots of standing and walking, 8 hr shifts ,3rd  PLOF: Independent  PATIENT GOALS: get rid of pain back , and be able to lift, move better   NEXT MD VISIT: 4 weeks  OBJECTIVE:   DIAGNOSTIC FINDINGS:  Had LS x rays and MRI but not available today  PATIENT SURVEYS:  Modified Oswestry 20/50 or 40%   SCREENING FOR RED FLAGS: Bowel or bladder incontinence: No Spinal tumors: No Cauda equina syndrome: No Compression fracture: No Abdominal aneurysm: No  COGNITION: Overall cognitive status: Within functional limits for tasks assessed     SENSATION: Reports B hand numbness with prolonged shoulder elevation/ I.e., driving  MUSCLE LENGTH: Hamstrings: Right -40 deg; Left -40 de  POSTURE: L iliac crest high, L shoulder low, flattened LS spine, loss of gluteal mass,  L side bending with forward flexion standing  PALPATION: PA glides thoraco lumbar junciton and post ribs 6 to 9 B tender  LUMBAR ROM:   AROM eval  Flexion 50%  Extension wnl  Right lateral flexion 75%  Left lateral flexion 50%  Right rotation   Left rotation      LOWER EXTREMITY ROM:   all LE ROM WNL    LOWER EXTREMITY MMT:  all MMT LE's wfl B  LUMBAR SPECIAL TESTS:  Straight leg raise test: Negative and FABER test: Negative  GAIT: Distance walked: over 100'  in the clinic without deficits   TODAY'S TREATMENT:                                                                                                                              DATE:  10/28/22 NuStep L5 x38mins Calf stretch 30s Seated rows and lats 45# 2x10 blackTB ext 2x10  Supine trunk rotations Stretching HS 30s both sides  Pball forward flexion x10  STM with massage gun to upper traps, and low back  Shoulder ext 10# 2x10   10/26/22 Nustep level 5 x 5 minutes Feet on ball K2C, rotation, small bridge, iso abs Passive HS and piriformis stretches DN to the bilateral upper traps with good LTR's STM with the theragun to the upper traps the neck and the rhomboids Thoracic segmental mobility PA and rotational glides Education on some posture and body mechanics for his job, education on POC and his dx  10/19/22: Manual:  HVLA C6/7 Supine contract/relax upper cervical spine for R rotation with improvement noted  Therapeutic exercise:   Seated rows 45#, 2 sets 20x B LE leg press, 40#, 15 x 3 Black metal bar for B calf stretch and heel raises. Seated stretch with blue ball, forward B shoulder flexion , thoracic extension stretch.  10/14/22: Therapeutic exercise: Nustep level 6, 5 min, Ue's and LE's  lat rows 45#, 2 sets 15 reps  B shoulder rows 45# 2 sets 15 reps  Nustep level 5, UE's and LE's , 7 min for tissue perfusion, endurance , recumbent to avoid compressive forces B knees   Leg press, 40# 3 sets 15, advised to press more through heels to isolate post musculature , gluts  Seated large blue therapeutic ball for stretching for/ side/ rotation Supine large ball B knee to chest, LTR   10/12/22: Therapeutic exercise: Seated for lumbar and thoracic stretch with 65cm ball, 5 reps, 10 sec holds, forward Supine for B knee to chest stretch, feet on ball Supine for B LTR feet on ball  L side lying for one bout of muscle energy technique to address restricted L lumbar flexion, rot,  side bending 5 sec bouts  Nustep level 4 Ue's and LE's 5 min  Seated lat rows 45#, 2 sets 15 reps  B shoulder rows 45# 2 sets 15 reps  Supine thoracic spine stretch over 1/2 roll, roll parallel to spine for pecs and postural stretch 10/07/22: Manual:  prone for PA HVLA mobs mid and lower thoracic spine PA glides B post ribs mid and lower L side lying for muscle energy to isolate flex, rot. SB stretch lower L lumbar spine one set 8 reps, 5 sec holds  Therex:  lat rows 45#, 2 sets 15 reps  B shoulder rows 45# 2 sets 15 reps Supine with 65 cm ball under legs, for B knee to chest, LTR No new ex added for home.  10/05/22:  Manual:  L side lying for muscle energy to improve L lumbar flexion/rot/SB restriction, 5 reps, 5 sec holds.  Reassessed LS forward bending in standing immediately afterward and improved motion noted  Therex: instructed in standing lumbar flexion R rotation and R side bending stretch with R hands on R lateral lower leg, and R foot on 4" step stool, to gap L lumbar spine  Also standing in door way for pec stretch, cues to maintain shoulders at 90 degrees abd.     PATIENT EDUCATION:  Education details: POC, goals Person educated: Patient Education method: Explanation, Demonstration, Tactile cues, and Verbal cues Education comprehension: verbalized understanding  HOME EXERCISE PROGRAM: None printed, instructed as above  ASSESSMENT:  CLINICAL IMPRESSION: Patient is still having pain but reports the needles helps out especially in his upper traps and neck. We focused on mostly back strengthening and then worked on some mobility and stretching. Did some STM with the gun as well. Reports his pain is manageable, no increase or c/o of pain throughout session  OBJECTIVE IMPAIRMENTS: decreased activity tolerance, decreased mobility, difficulty walking, decreased ROM, postural dysfunction, and pain.   ACTIVITY LIMITATIONS: lifting, bending, sitting, and locomotion  level  PARTICIPATION LIMITATIONS: driving, community activity, and yard work  PERSONAL FACTORS: Fitness and 1-2 comorbidities: chronic DJD knees, obesity  are also affecting patient's functional outcome.   REHAB POTENTIAL: Good  CLINICAL DECISION MAKING: Stable/uncomplicated  EVALUATION COMPLEXITY: Low   GOALS: Goals reviewed with patient? Yes  SHORT TERM GOALS: Target date: 2 weeks 10/19/22  I HEP Baseline:initiated at eval Goal status: met 10/26/22  LONG TERM GOALS: Target date: 11/30/22  Oswestry improve to 30% disability Baseline: 40% Goal status: IN PROGRESS  2.  Improve lumbar ROM for forward bending to wnl without pain provocation spine Baseline: 50% with pain Goal status: IN PROGRESS  3.  Improve Sx B Ue's, reduce frequency of B UE paresthesia to 0% Baseline: occurs with driving, activities using B hands Goal status: IN PROGRESS  PLAN:  PT FREQUENCY: 1-2x/week  PT DURATION: 8 weeks  PLANNED INTERVENTIONS: Therapeutic exercises, Therapeutic activity, Neuromuscular re-education, Balance training, Gait training, Patient/Family education, Self Care, and Joint mobilization.  PLAN FOR NEXT SESSION: additional therx, mobilization lumbar and thoracic , see how the DN did and see if we want to try traction   Cassie Freer, PT 10/28/2022, 8:45 AM

## 2022-10-28 ENCOUNTER — Ambulatory Visit: Payer: No Typology Code available for payment source | Attending: Chiropractic Medicine

## 2022-10-28 DIAGNOSIS — R6889 Other general symptoms and signs: Secondary | ICD-10-CM | POA: Diagnosis present

## 2022-10-28 DIAGNOSIS — M2569 Stiffness of other specified joint, not elsewhere classified: Secondary | ICD-10-CM | POA: Insufficient documentation

## 2022-10-28 DIAGNOSIS — M549 Dorsalgia, unspecified: Secondary | ICD-10-CM | POA: Diagnosis present

## 2022-11-02 ENCOUNTER — Encounter: Payer: Self-pay | Admitting: Gastroenterology

## 2022-11-03 ENCOUNTER — Encounter (HOSPITAL_BASED_OUTPATIENT_CLINIC_OR_DEPARTMENT_OTHER): Payer: Self-pay | Admitting: Physical Therapy

## 2022-11-03 ENCOUNTER — Encounter (HOSPITAL_BASED_OUTPATIENT_CLINIC_OR_DEPARTMENT_OTHER): Payer: No Typology Code available for payment source | Attending: Chiropractic Medicine | Admitting: Physical Therapy

## 2022-11-03 DIAGNOSIS — M549 Dorsalgia, unspecified: Secondary | ICD-10-CM | POA: Insufficient documentation

## 2022-11-03 DIAGNOSIS — R6889 Other general symptoms and signs: Secondary | ICD-10-CM | POA: Insufficient documentation

## 2022-11-03 DIAGNOSIS — M2569 Stiffness of other specified joint, not elsewhere classified: Secondary | ICD-10-CM

## 2022-11-03 NOTE — Therapy (Signed)
OUTPATIENT PHYSICAL THERAPY THORACOLUMBAR TREATMENT   Patient Name: Michael Carter MRN: [id number] DOB:1985-11-24, 37 y.o., male Today's Date: 11/03/2022  END OF SESSION:  PT End of Session - 11/03/22 1658     Visit Number 8    Date for PT Re-Evaluation 11/30/22    Authorization Type [facility]    PT Start Time 1700    PT Stop Time 1745    PT Time Calculation (min) 45 min    Activity Tolerance Patient tolerated treatment well    Behavior During Therapy WFL for tasks assessed/performed               Past Medical History:  Diagnosis Date   Acid reflux    Asthma    Chronic pain    CKD (chronic kidney disease)    DDD (degenerative disc disease), lumbar    Headache    Liver disease    OSA (obstructive sleep apnea)    Plantar fasciitis    Vitamin D deficiency    Past Surgical History:  Procedure Laterality Date   NO PAST SURGERIES     Patient Active Problem List   Diagnosis Date Noted   Migraine without aura and without status migrainosus, not intractable 04/01/2020    PCP: Michael Carter  REFERRING PROVIDER: Su Hoff  REFERRING DIAG: myofscial pain syndrome, midline back pain  Rationale for Evaluation and Treatment: Rehabilitation  THERAPY DIAG:  Mid back pain  Back stiffness  Impaired tolerance of activity  ONSET DATE: 3 months ago  SUBJECTIVE:                                                                                                                                                                                           SUBJECTIVE STATEMENT: neck stiff; headache 4/10 got hit in the head at work last night, back stiff.   PERTINENT HISTORY:  December was hurt at work when shirt was caught in conveyer belt, which pulled him in partially and he had to forcibly push himself out of the machine backwards.  MD has recommended injections.  B knee pain chronic. States that referring MD wants him to participate in aquatic PT.  PAIN:  Are you having pain?  Yes: NPRS scale: "stiff" 0/10 Pain location: lower back radiating up to cervico thoracic jxn Pain description: intermittent, very sharp Aggravating factors: certain reaching activities sometimes when reaching Relieving factors: pain lasts several hours when it occurs.  Takes tramadol with flare ups  PRECAUTIONS: None  WEIGHT BEARING RESTRICTIONS: No  FALLS:  Has patient fallen in last 6 months? No  LIVING ENVIRONMENT: Lives with: lives with their family, lives with  their partner, and lives with their son Lives in: House/apartment Stairs: Yes: Internal: 4 steps; can reach both Has following equipment at home: None  OCCUPATION: runs a roll foil machine, lots of standing and walking, 8 hr shifts ,3rd  PLOF: Independent  PATIENT GOALS: get rid of pain back , and be able to lift, move better   NEXT MD VISIT: 4 weeks  OBJECTIVE:   DIAGNOSTIC FINDINGS:  Had LS x rays and MRI but not available today  PATIENT SURVEYS:  Modified Oswestry 20/50 or 40%   SCREENING FOR RED FLAGS: Bowel or bladder incontinence: No Spinal tumors: No Cauda equina syndrome: No Compression fracture: No Abdominal aneurysm: No  COGNITION: Overall cognitive status: Within functional limits for tasks assessed     SENSATION: Reports B hand numbness with prolonged shoulder elevation/ I.e., driving  MUSCLE LENGTH: Hamstrings: Right -40 deg; Left -40 de  POSTURE: L iliac crest high, L shoulder low, flattened LS spine, loss of gluteal mass,  L side bending with forward flexion standing  PALPATION: PA glides thoraco lumbar junciton and post ribs 6 to 9 B tender  LUMBAR ROM:   AROM eval  Flexion 50%  Extension wnl  Right lateral flexion 75%  Left lateral flexion 50%  Right rotation   Left rotation      LOWER EXTREMITY ROM:   all LE ROM WNL    LOWER EXTREMITY MMT:  all MMT LE's wfl B  LUMBAR SPECIAL TESTS:  Straight leg raise test: Negative and FABER test: Negative  GAIT: Distance  walked: over 100' in the clinic without deficits   TODAY'S TREATMENT:                                                                                                                              DATE:  11/03/22 Pt seen for aquatic therapy today.  Treatment took place in water 3.5-4.75 ft in depth at the [facility] pool. Temp of water was 91.  Pt entered/exited the pool via stairs using step through pattern  with hand rail.  *intro to setting *Forward, backward and side stepping *L stretch; then with tail wagging *Using yellow hand buoys: Shoulder horizontal add/abd; resisted shoulder depression; bow and arrow R/L x 10-12 *lumbar rotation *Leaning against wall using solid noodle: hamstring, gastroc, adductor; SKTC *Core engagement TrA isometric: Yellow noodle pull down in staggered stance then wide stance x 10.  VC and demonstration for proper execution *hip hinging. VC and demonstration for execution  Pt requires the buoyancy and hydrostatic pressure of water for support, and to offload joints by unweighting joint load by at least 50 % in navel deep water and by at least 75-80% in chest to neck deep water.  Viscosity of the water is needed for resistance of strengthening. Water current perturbations provides challenge to standing balance requiring increased core activation.    10/28/22 NuStep L5 x88mins Calf stretch 30s Seated rows and lats 45# 2x10 blackTB ext  2x10  Supine trunk rotations Stretching HS 30s both sides  Pball forward flexion x10  STM with massage gun to upper traps, and low back  Shoulder ext 10# 2x10   10/26/22 Nustep level 5 x 5 minutes Feet on ball K2C, rotation, small bridge, iso abs Passive HS and piriformis stretches DN to the bilateral upper traps with good LTR's STM with the theragun to the upper traps the neck and the rhomboids Thoracic segmental mobility PA and rotational glides Education on some posture and body mechanics for his job, education  on POC and his dx  10/19/22: Manual:  HVLA C6/7 Supine contract/relax upper cervical spine for R rotation with improvement noted  Therapeutic exercise:   Seated rows 45#, 2 sets 20x B LE leg press, 40#, 15 x 3 Black metal bar for B calf stretch and heel raises. Seated stretch with blue ball, forward B shoulder flexion , thoracic extension stretch.  10/14/22: Therapeutic exercise: Nustep level 6, 5 min, Ue's and LE's  lat rows 45#, 2 sets 15 reps  B shoulder rows 45# 2 sets 15 reps  Nustep level 5, UE's and LE's , 7 min for tissue perfusion, endurance , recumbent to avoid compressive forces B knees   Leg press, 40# 3 sets 15, advised to press more through heels to isolate post musculature , gluts  Seated large blue therapeutic ball for stretching for/ side/ rotation Supine large ball B knee to chest, LTR   10/12/22: Therapeutic exercise: Seated for lumbar and thoracic stretch with 65cm ball, 5 reps, 10 sec holds, forward Supine for B knee to chest stretch, feet on ball Supine for B LTR feet on ball  L side lying for one bout of muscle energy technique to address restricted L lumbar flexion, rot, side bending 5 sec bouts  Nustep level 4 Ue's and LE's 5 min  Seated lat rows 45#, 2 sets 15 reps  B shoulder rows 45# 2 sets 15 reps  Supine thoracic spine stretch over 1/2 roll, roll parallel to spine for pecs and postural stretch 10/07/22: Manual:  prone for PA HVLA mobs mid and lower thoracic spine PA glides B post ribs mid and lower L side lying for muscle energy to isolate flex, rot. SB stretch lower L lumbar spine one set 8 reps, 5 sec holds  Therex:  lat rows 45#, 2 sets 15 reps  B shoulder rows 45# 2 sets 15 reps Supine with 65 cm ball under legs, for B knee to chest, LTR No new ex added for home.  10/05/22:  Manual:  L side lying for muscle energy to improve L lumbar flexion/rot/SB restriction, 5 reps, 5 sec holds.  Reassessed LS forward bending in standing immediately  afterward and improved motion noted  Therex: instructed in standing lumbar flexion R rotation and R side bending stretch with R hands on R lateral lower leg, and R foot on 4" step stool, to gap L lumbar spine  Also standing in door way for pec stretch, cues to maintain shoulders at 90 degrees abd.     PATIENT EDUCATION:  Education details: POC, goals Person educated: Patient Education method: Explanation, Demonstration, Tactile cues, and Verbal cues Education comprehension: verbalized understanding  HOME EXERCISE PROGRAM: None printed, instructed as above  ASSESSMENT:  CLINICAL IMPRESSION: Pt reports back stiffness today not pain.  Headache due to hitting head at work last night but not limiting performance. Focused mainly on general stretching and mobility. Pt follows directions well with good execution of tasks.  Good strength demonstrated in core as well as ample ROM in hip flex, abd and IT band.  Upon completion pt reports back feels lose bur shoulders a little sore. He is a good candidate for aquatic intervention and will benefit from the properties of water to progress toward meeting all goals.    OBJECTIVE IMPAIRMENTS: decreased activity tolerance, decreased mobility, difficulty walking, decreased ROM, postural dysfunction, and pain.   ACTIVITY LIMITATIONS: lifting, bending, sitting, and locomotion level  PARTICIPATION LIMITATIONS: driving, community activity, and yard work  PERSONAL FACTORS: Fitness and 1-2 comorbidities: chronic DJD knees, obesity  are also affecting patient's functional outcome.   REHAB POTENTIAL: Good  CLINICAL DECISION MAKING: Stable/uncomplicated  EVALUATION COMPLEXITY: Low   GOALS: Goals reviewed with patient? Yes  SHORT TERM GOALS: Target date: 2 weeks 10/19/22  I HEP Baseline:initiated at eval Goal status: met 10/26/22  LONG TERM GOALS: Target date: 11/30/22  Oswestry improve to 30% disability Baseline: 40% Goal status: IN PROGRESS  2.   Improve lumbar ROM for forward bending to wnl without pain provocation spine Baseline: 50% with pain Goal status: IN PROGRESS  3.  Improve Sx B Ue's, reduce frequency of B UE paresthesia to 0% Baseline: occurs with driving, activities using B hands Goal status: IN PROGRESS  PLAN:  PT FREQUENCY: 1-2x/week  PT DURATION: 8 weeks  PLANNED INTERVENTIONS: Therapeutic exercises, Therapeutic activity, Neuromuscular re-education, Balance training, Gait training, Patient/Family education, Self Care, and Joint mobilization.  PLAN FOR NEXT SESSION: additional therx, mobilization lumbar and thoracic , see how the DN did and see if we want to try traction   Rushie Chestnut) Makylee Sanborn MPT 11/03/2022, 4:59 PM

## 2022-11-05 ENCOUNTER — Encounter: Payer: Self-pay | Admitting: Physical Therapy

## 2022-11-05 ENCOUNTER — Ambulatory Visit: Payer: No Typology Code available for payment source | Admitting: Physical Therapy

## 2022-11-05 DIAGNOSIS — M549 Dorsalgia, unspecified: Secondary | ICD-10-CM | POA: Diagnosis not present

## 2022-11-05 DIAGNOSIS — M2569 Stiffness of other specified joint, not elsewhere classified: Secondary | ICD-10-CM

## 2022-11-05 DIAGNOSIS — R6889 Other general symptoms and signs: Secondary | ICD-10-CM

## 2022-11-05 NOTE — Therapy (Signed)
OUTPATIENT PHYSICAL THERAPY THORACOLUMBAR TREATMENT   Patient Name: Michael Carter MRN: [id number] DOB:[birth year], 37 y.o., male Today's Date: 11/05/2022  END OF SESSION:  PT End of Session - 11/05/22 0816     Visit Number 9    Date for PT Re-Evaluation 11/30/22    Authorization Type [facility]    PT Start Time 0810    PT Stop Time 0900    PT Time Calculation (min) 50 min    Activity Tolerance Patient tolerated treatment well    Behavior During Therapy WFL for tasks assessed/performed               Past Medical History:  Diagnosis Date   Acid reflux    Asthma    Chronic pain    CKD (chronic kidney disease)    DDD (degenerative disc disease), lumbar    Headache    Liver disease    OSA (obstructive sleep apnea)    Plantar fasciitis    Vitamin D deficiency    Past Surgical History:  Procedure Laterality Date   NO PAST SURGERIES     Patient Active Problem List   Diagnosis Date Noted   Migraine without aura and without status migrainosus, not intractable 04/01/2020    PCP: Knox Royalty  REFERRING PROVIDER: Su Hoff  REFERRING DIAG: myofscial pain syndrome, midline back pain  Rationale for Evaluation and Treatment: Rehabilitation  THERAPY DIAG:  Mid back pain  Back stiffness  Impaired tolerance of activity  ONSET DATE: 3 months ago  SUBJECTIVE:                                                                                                                                                                                           SUBJECTIVE STATEMENT: Patient went to the ED yesterday after being hit in the crowbar slipping and hitting him in the head, feel she had a concussion.  C/O neck pain now  PERTINENT HISTORY:  December was hurt at work when shirt was caught in [employer], which pulled him in partially and he had to forcibly push himself out of the machine backwards.  MD has recommended injections.  B knee pain chronic. States that referring MD wants  him to participate in aquatic PT.  PAIN:  Are you having pain? Yes: NPRS scale: "stiff" 6/10 Pain location: lower back radiating up to cervico thoracic jxn Pain description: intermittent, very sharp Aggravating factors: certain reaching activities sometimes when reaching Relieving factors: pain lasts several hours when it occurs.  Takes tramadol with flare ups  PRECAUTIONS: None  WEIGHT BEARING RESTRICTIONS: No  FALLS:  Has patient fallen in last 6  months? No  LIVING ENVIRONMENT: Lives with: lives with their family, lives with their partner, and lives with their son Lives in: House/apartment Stairs: Yes: Internal: 4 steps; can reach both Has following equipment at home: None  OCCUPATION: runs a roll foil machine, lots of standing and walking, 8 hr shifts ,3rd  PLOF: Independent  PATIENT GOALS: get rid of pain back , and be able to lift, move better   NEXT MD VISIT: 4 weeks  OBJECTIVE:   DIAGNOSTIC FINDINGS:  Had LS x rays and MRI but not available today  PATIENT SURVEYS:  Modified Oswestry 20/50 or 40%   SCREENING FOR RED FLAGS: Bowel or bladder incontinence: No Spinal tumors: No Cauda equina syndrome: No Compression fracture: No Abdominal aneurysm: No  COGNITION: Overall cognitive status: Within functional limits for tasks assessed     SENSATION: Reports B hand numbness with prolonged shoulder elevation/ I.e., driving  MUSCLE LENGTH: Hamstrings: Right -40 deg; Left -40 de  POSTURE: L iliac crest high, L shoulder low, flattened LS spine, loss of gluteal mass,  L side bending with forward flexion standing  PALPATION: PA glides thoraco lumbar junciton and post ribs 6 to 9 B tender  LUMBAR ROM:   AROM eval  Flexion 50%  Extension wnl  Right lateral flexion 75%  Left lateral flexion 50%  Right rotation   Left rotation      LOWER EXTREMITY ROM:   all LE ROM WNL    LOWER EXTREMITY MMT:  all MMT LE's wfl B  LUMBAR SPECIAL TESTS:  Straight leg  raise test: Negative and FABER test: Negative  GAIT: Distance walked: over 100' in the clinic without deficits   TODAY'S TREATMENT:                                                                                                                              DATE:  11/05/22 Patient was hit in the head yesterday and saw ED, has a concussion, MD was to not exert the next few days. DN to the upper traps. STM to the above Education on the concussion and the need to let the brain rest and recover MHP/IFC to the upper traps and cervical area  11/03/22 Pt seen for aquatic therapy today.  Treatment took place in water 3.5-4.75 ft in depth at the [facility] pool. Temp of water was 91.  Pt entered/exited the pool via stairs using step through pattern  with hand rail.  *intro to setting *Forward, backward and side stepping *L stretch; then with tail wagging *Using yellow hand buoys: Shoulder horizontal add/abd; resisted shoulder depression; bow and arrow R/L x 10-12 *lumbar rotation *Leaning against wall using solid noodle: hamstring, gastroc, adductor; SKTC *Core engagement TrA isometric: Yellow noodle pull down in staggered stance then wide stance x 10.  VC and demonstration for proper execution *hip hinging. VC and demonstration for execution  Pt requires the buoyancy and hydrostatic pressure of water for support, and  to offload joints by unweighting joint load by at least 50 % in navel deep water and by at least 75-80% in chest to neck deep water.  Viscosity of the water is needed for resistance of strengthening. Water current perturbations provides challenge to standing balance requiring increased core activation.    10/28/22 NuStep L5 x99mins Calf stretch 30s Seated rows and lats 45# 2x10 blackTB ext 2x10  Supine trunk rotations Stretching HS 30s both sides  Pball forward flexion x10  STM with massage gun to upper traps, and low back  Shoulder ext 10# 2x10   10/26/22 Nustep  level 5 x 5 minutes Feet on ball K2C, rotation, small bridge, iso abs Passive HS and piriformis stretches DN to the bilateral upper traps with good LTR's STM with the theragun to the upper traps the neck and the rhomboids Thoracic segmental mobility PA and rotational glides Education on some posture and body mechanics for his job, education on POC and his dx  10/19/22: Manual:  HVLA C6/7 Supine contract/relax upper cervical spine for R rotation with improvement noted  Therapeutic exercise:   Seated rows 45#, 2 sets 20x B LE leg press, 40#, 15 x 3 Black metal bar for B calf stretch and heel raises. Seated stretch with blue ball, forward B shoulder flexion , thoracic extension stretch.  10/14/22: Therapeutic exercise: Nustep level 6, 5 min, Ue's and LE's  lat rows 45#, 2 sets 15 reps  B shoulder rows 45# 2 sets 15 reps  Nustep level 5, UE's and LE's , 7 min for tissue perfusion, endurance , recumbent to avoid compressive forces B knees   Leg press, 40# 3 sets 15, advised to press more through heels to isolate post musculature , gluts  Seated large blue therapeutic ball for stretching for/ side/ rotation Supine large ball B knee to chest, LTR   10/12/22: Therapeutic exercise: Seated for lumbar and thoracic stretch with 65cm ball, 5 reps, 10 sec holds, forward Supine for B knee to chest stretch, feet on ball Supine for B LTR feet on ball  L side lying for one bout of muscle energy technique to address restricted L lumbar flexion, rot, side bending 5 sec bouts  Nustep level 4 Ue's and LE's 5 min  Seated lat rows 45#, 2 sets 15 reps  B shoulder rows 45# 2 sets 15 reps  Supine thoracic spine stretch over 1/2 roll, roll parallel to spine for pecs and postural stretch 10/07/22: Manual:  prone for PA HVLA mobs mid and lower thoracic spine PA glides B post ribs mid and lower L side lying for muscle energy to isolate flex, rot. SB stretch lower L lumbar spine one set 8 reps, 5 sec  holds  Therex:  lat rows 45#, 2 sets 15 reps  B shoulder rows 45# 2 sets 15 reps Supine with 65 cm ball under legs, for B knee to chest, LTR No new ex added for home.  10/05/22:  Manual:  L side lying for muscle energy to improve L lumbar flexion/rot/SB restriction, 5 reps, 5 sec holds.  Reassessed LS forward bending in standing immediately afterward and improved motion noted  Therex: instructed in standing lumbar flexion R rotation and R side bending stretch with R hands on R lateral lower leg, and R foot on 4" step stool, to gap L lumbar spine  Also standing in door way for pec stretch, cues to maintain shoulders at 90 degrees abd.     PATIENT EDUCATION:  Education details: POC,  goals Person educated: Patient Education method: Explanation, Demonstration, Tactile cues, and Verbal cues Education comprehension: verbalized understanding  HOME EXERCISE PROGRAM: None printed, instructed as above  ASSESSMENT:  CLINICAL IMPRESSION: Patient sustained a concussion yesterday, he went to the ED, had x-rays that show some central stenosis and some lateral stenosis.  ED MD note said no exertion for a few days sow we did mostly palliative care working on neck spasms and pain    OBJECTIVE IMPAIRMENTS: decreased activity tolerance, decreased mobility, difficulty walking, decreased ROM, postural dysfunction, and pain.   ACTIVITY LIMITATIONS: lifting, bending, sitting, and locomotion level  PARTICIPATION LIMITATIONS: driving, community activity, and yard work  PERSONAL FACTORS: Fitness and 1-2 comorbidities: chronic DJD knees, obesity  are also affecting patient's functional outcome.   REHAB POTENTIAL: Good  CLINICAL DECISION MAKING: Stable/uncomplicated  EVALUATION COMPLEXITY: Low   GOALS: Goals reviewed with patient? Yes  SHORT TERM GOALS: Target date: 2 weeks 10/19/22  I HEP Baseline:initiated at eval Goal status: met 10/26/22  LONG TERM GOALS: Target date: 11/30/22  Oswestry  improve to 30% disability Baseline: 40% Goal status: IN PROGRESS  2.  Improve lumbar ROM for forward bending to wnl without pain provocation spine Baseline: 50% with pain Goal status: IN PROGRESS  3.  Improve Sx B Ue's, reduce frequency of B UE paresthesia to 0% Baseline: occurs with driving, activities using B hands Goal status: IN PROGRESS  PLAN:  PT FREQUENCY: 1-2x/week  PT DURATION: 8 weeks  PLANNED INTERVENTIONS: Therapeutic exercises, Therapeutic activity, Neuromuscular re-education, Balance training, Gait training, Patient/Family education, Self Care, and Joint mobilization.  PLAN FOR NEXT SESSION: additional therx, mobilization lumbar and thoracic , see how the DN did and see if we want to try traction  Stacie Glaze, PT 11/05/2022, 8:20 AM

## 2022-11-09 ENCOUNTER — Encounter (HOSPITAL_BASED_OUTPATIENT_CLINIC_OR_DEPARTMENT_OTHER): Payer: Self-pay | Admitting: Physical Therapy

## 2022-11-09 ENCOUNTER — Encounter (HOSPITAL_BASED_OUTPATIENT_CLINIC_OR_DEPARTMENT_OTHER): Payer: No Typology Code available for payment source | Admitting: Physical Therapy

## 2022-11-09 DIAGNOSIS — M549 Dorsalgia, unspecified: Secondary | ICD-10-CM

## 2022-11-09 DIAGNOSIS — M2569 Stiffness of other specified joint, not elsewhere classified: Secondary | ICD-10-CM

## 2022-11-09 NOTE — Therapy (Signed)
OUTPATIENT PHYSICAL THERAPY THORACOLUMBAR TREATMENT   Patient Name: Michael Carter MRN: [id number] DOB:[birth year], 37 y.o., male Today's Date: 11/09/2022  END OF SESSION:  PT End of Session - 11/09/22 0901     Visit Number 10    Date for PT Re-Evaluation 11/30/22    Authorization Type [facility]    PT Start Time 0901    PT Stop Time 0940    PT Time Calculation (min) 39 min    Activity Tolerance Patient tolerated treatment well    Behavior During Therapy WFL for tasks assessed/performed               Past Medical History:  Diagnosis Date   Acid reflux    Asthma    Chronic pain    CKD (chronic kidney disease)    DDD (degenerative disc disease), lumbar    Headache    Liver disease    OSA (obstructive sleep apnea)    Plantar fasciitis    Vitamin D deficiency    Past Surgical History:  Procedure Laterality Date   NO PAST SURGERIES     Patient Active Problem List   Diagnosis Date Noted   Migraine without aura and without status migrainosus, not intractable 04/01/2020    PCP: Knox Royalty  REFERRING PROVIDER: Su Hoff  REFERRING DIAG: myofscial pain syndrome, midline back pain  Rationale for Evaluation and Treatment: Rehabilitation  THERAPY DIAG:  Mid back pain  Back stiffness  ONSET DATE: 3 months ago  SUBJECTIVE:                                                                                                                                                                                           SUBJECTIVE STATEMENT: Patient reports his concussion symptoms have lessened; has follow up with Dr tomorrow.  He reports the DN "went great" last session.  He states he doesn't currently have access to pool, but he may check out [facility] nearby.    PERTINENT HISTORY:  December was hurt at work when shirt was caught in conveyer belt, which pulled him in partially and he had to forcibly push himself out of the machine backwards.  MD has recommended injections.  B knee  pain chronic. States that referring MD wants him to participate in aquatic PT.  PAIN:  Are you having pain? Yes: NPRS scale: "stiff" 6/10 Pain location: lower back radiating up to cervico thoracic jxn Pain description: intermittent, very sharp Aggravating factors: certain reaching activities sometimes when reaching Relieving factors: pain lasts several hours when it occurs.  Takes tramadol with flare ups  PRECAUTIONS: None  WEIGHT BEARING RESTRICTIONS: No  FALLS:  Has patient fallen in last 6 months? No  LIVING ENVIRONMENT: Lives with: lives with their family, lives with their partner, and lives with their son Lives in: House/apartment Stairs: Yes: Internal: 4 steps; can reach both Has following equipment at home: None  OCCUPATION: runs a roll foil machine, lots of standing and walking, 8 hr shifts ,3rd  PLOF: Independent  PATIENT GOALS: get rid of pain back , and be able to lift, move better   NEXT MD VISIT: 4 weeks  OBJECTIVE:   DIAGNOSTIC FINDINGS:  Had LS x rays and MRI but not available today  PATIENT SURVEYS:  Modified Oswestry 20/50 or 40%   SCREENING FOR RED FLAGS: Bowel or bladder incontinence: No Spinal tumors: No Cauda equina syndrome: No Compression fracture: No Abdominal aneurysm: No  COGNITION: Overall cognitive status: Within functional limits for tasks assessed     SENSATION: Reports B hand numbness with prolonged shoulder elevation/ I.e., driving  MUSCLE LENGTH: Hamstrings: Right -40 deg; Left -40 de  POSTURE: L iliac crest high, L shoulder low, flattened LS spine, loss of gluteal mass,  L side bending with forward flexion standing  PALPATION: PA glides thoraco lumbar junciton and post ribs 6 to 9 B tender  LUMBAR ROM:   AROM eval  Flexion 50%  Extension wnl  Right lateral flexion 75%  Left lateral flexion 50%  Right rotation   Left rotation      LOWER EXTREMITY ROM:   all LE ROM WNL    LOWER EXTREMITY MMT:  all MMT LE's wfl  B  LUMBAR SPECIAL TESTS:  Straight leg raise test: Negative and FABER test: Negative  GAIT: Distance walked: over 100' in the clinic without deficits   TODAY'S TREATMENT:                                                                                                                              DATE:  11/09/22 Pt seen for aquatic therapy today.  Treatment took place in water 3.5-4.75 ft in depth at the [facility] pool. Temp of water was 91.  Pt entered/exited the pool via stairs using step through pattern  with hand rail.  *unsupported: walking Forward/ backward and side stepping with arm addct/abdct ->with rainbow and yellow hand floats * staggered stance with yellow hand floats:  tricep press downs; bilat shoulder horiz abdct/addct  ~8-10 reps each  * bow and arrow with step back 5 reps each side, 2 sets  * TrA set with solid noodle pull down x 15 * shoulder rolls, lateral neck flexion stretches * Back against wall, with solid noodle under ankle:  hamstring/ ITB/ adductor stretch x 15s x 2 reps each LE * quad stretch with foot supported by solid noodle at ankle x15s x 2 with forward lean/straight leg in between reps * at stairs - fig 4 stretch x 20s each LE   11/05/22 Patient was hit in the head yesterday and saw ED, has  a concussion, MD was to not exert the next few days. DN to the upper traps. STM to the above Education on the concussion and the need to let the brain rest and recover MHP/IFC to the upper traps and cervical area  11/03/22 Pt seen for aquatic therapy today.  Treatment took place in water 3.5-4.75 ft in depth at the [facility] pool. Temp of water was 91.  Pt entered/exited the pool via stairs using step through pattern  with hand rail.  *intro to setting *Forward, backward and side stepping *L stretch; then with tail wagging *Using yellow hand buoys: Shoulder horizontal add/abd; resisted shoulder depression; bow and arrow R/L x 10-12 *lumbar  rotation *Leaning against wall using solid noodle: hamstring, gastroc, adductor; SKTC *Core engagement TrA isometric: Yellow noodle pull down in staggered stance then wide stance x 10.  VC and demonstration for proper execution *hip hinging. VC and demonstration for execution  Pt requires the buoyancy and hydrostatic pressure of water for support, and to offload joints by unweighting joint load by at least 50 % in navel deep water and by at least 75-80% in chest to neck deep water.  Viscosity of the water is needed for resistance of strengthening. Water current perturbations provides challenge to standing balance requiring increased core activation.    10/28/22 NuStep L5 x75mins Calf stretch 30s Seated rows and lats 45# 2x10 blackTB ext 2x10  Supine trunk rotations Stretching HS 30s both sides  Pball forward flexion x10  STM with massage gun to upper traps, and low back  Shoulder ext 10# 2x10   10/26/22 Nustep level 5 x 5 minutes Feet on ball K2C, rotation, small bridge, iso abs Passive HS and piriformis stretches DN to the bilateral upper traps with good LTR's STM with the theragun to the upper traps the neck and the rhomboids Thoracic segmental mobility PA and rotational glides Education on some posture and body mechanics for his job, education on POC and his dx  10/19/22: Manual:  HVLA C6/7 Supine contract/relax upper cervical spine for R rotation with improvement noted  Therapeutic exercise:   Seated rows 45#, 2 sets 20x B LE leg press, 40#, 15 x 3 Black metal bar for B calf stretch and heel raises. Seated stretch with blue ball, forward B shoulder flexion , thoracic extension stretch.  10/14/22: Therapeutic exercise: Nustep level 6, 5 min, Ue's and LE's  lat rows 45#, 2 sets 15 reps  B shoulder rows 45# 2 sets 15 reps  Nustep level 5, UE's and LE's , 7 min for tissue perfusion, endurance , recumbent to avoid compressive forces B knees   Leg press, 40# 3 sets 15, advised  to press more through heels to isolate post musculature , gluts  Seated large blue therapeutic ball for stretching for/ side/ rotation Supine large ball B knee to chest, LTR   10/12/22: Therapeutic exercise: Seated for lumbar and thoracic stretch with 65cm ball, 5 reps, 10 sec holds, forward Supine for B knee to chest stretch, feet on ball Supine for B LTR feet on ball  L side lying for one bout of muscle energy technique to address restricted L lumbar flexion, rot, side bending 5 sec bouts  Nustep level 4 Ue's and LE's 5 min  Seated lat rows 45#, 2 sets 15 reps  B shoulder rows 45# 2 sets 15 reps  Supine thoracic spine stretch over 1/2 roll, roll parallel to spine for pecs and postural stretch 10/07/22: Manual:  prone for PA HVLA  mobs mid and lower thoracic spine PA glides B post ribs mid and lower L side lying for muscle energy to isolate flex, rot. SB stretch lower L lumbar spine one set 8 reps, 5 sec holds  Therex:  lat rows 45#, 2 sets 15 reps  B shoulder rows 45# 2 sets 15 reps Supine with 65 cm ball under legs, for B knee to chest, LTR No new ex added for home.  10/05/22:  Manual:  L side lying for muscle energy to improve L lumbar flexion/rot/SB restriction, 5 reps, 5 sec holds.  Reassessed LS forward bending in standing immediately afterward and improved motion noted  Therex: instructed in standing lumbar flexion R rotation and R side bending stretch with R hands on R lateral lower leg, and R foot on 4" step stool, to gap L lumbar spine  Also standing in door way for pec stretch, cues to maintain shoulders at 90 degrees abd.     PATIENT EDUCATION:  Education details: aquatic exercise rationale and modifications  Person educated: Patient Education method: Programmer, multimedia, Demonstration, Tactile cues, and Verbal cues Education comprehension: verbalized understanding  HOME EXERCISE PROGRAM: None printed, instructed as above  ASSESSMENT:  CLINICAL IMPRESSION: Pt's  concussion symptoms gradually improving; no issues reported while in the water.  He reported slight reduction of pain at end of session.  It appears his RLE/hip is tighter than LLE with LE stretches and gait in pool; encouraged continued LE stretches on land.  Pt is progressing towards LTGs.    OBJECTIVE IMPAIRMENTS: decreased activity tolerance, decreased mobility, difficulty walking, decreased ROM, postural dysfunction, and pain.   ACTIVITY LIMITATIONS: lifting, bending, sitting, and locomotion level  PARTICIPATION LIMITATIONS: driving, community activity, and yard work  PERSONAL FACTORS: Fitness and 1-2 comorbidities: chronic DJD knees, obesity  are also affecting patient's functional outcome.   REHAB POTENTIAL: Good  CLINICAL DECISION MAKING: Stable/uncomplicated  EVALUATION COMPLEXITY: Low   GOALS: Goals reviewed with patient? Yes  SHORT TERM GOALS: Target date: 2 weeks 10/19/22  I HEP Baseline:initiated at eval Goal status: met 10/26/22  LONG TERM GOALS: Target date: 11/30/22  Oswestry improve to 30% disability Baseline: 40% Goal status: IN PROGRESS  2.  Improve lumbar ROM for forward bending to wnl without pain provocation spine Baseline: 50% with pain Goal status: IN PROGRESS  3.  Improve Sx B Ue's, reduce frequency of B UE paresthesia to 0% Baseline: occurs with driving, activities using B hands Goal status: IN PROGRESS  PLAN:  PT FREQUENCY: 1-2x/week  PT DURATION: 8 weeks  PLANNED INTERVENTIONS: Therapeutic exercises, Therapeutic activity, Neuromuscular re-education, Balance training, Gait training, Patient/Family education, Self Care, and Joint mobilization.  PLAN FOR NEXT SESSION: additional therx, mobilization lumbar and thoracic , on land:  see how the DN did and see if we want to try traction. Establish aquatic HEP   Mayer Camel, [place] 11/09/22 9:48 AM [facility] [address] [place],  [place], [address] Phone: [phone]   Fax:  [phone]

## 2022-11-12 ENCOUNTER — Encounter (HOSPITAL_BASED_OUTPATIENT_CLINIC_OR_DEPARTMENT_OTHER): Payer: Self-pay | Admitting: Physical Therapy

## 2022-11-12 ENCOUNTER — Encounter (HOSPITAL_BASED_OUTPATIENT_CLINIC_OR_DEPARTMENT_OTHER): Payer: No Typology Code available for payment source | Admitting: Physical Therapy

## 2022-11-12 DIAGNOSIS — R6889 Other general symptoms and signs: Secondary | ICD-10-CM

## 2022-11-12 DIAGNOSIS — M549 Dorsalgia, unspecified: Secondary | ICD-10-CM

## 2022-11-12 DIAGNOSIS — M2569 Stiffness of other specified joint, not elsewhere classified: Secondary | ICD-10-CM
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
## 2022-11-16 ENCOUNTER — Encounter (HOSPITAL_BASED_OUTPATIENT_CLINIC_OR_DEPARTMENT_OTHER): Payer: No Typology Code available for payment source | Admitting: Physical Therapy

## 2022-11-16 ENCOUNTER — Encounter (HOSPITAL_BASED_OUTPATIENT_CLINIC_OR_DEPARTMENT_OTHER): Payer: Self-pay | Admitting: Physical Therapy

## 2022-11-16 DIAGNOSIS — M2569 Stiffness of other specified joint, not elsewhere classified: Secondary | ICD-10-CM

## 2022-11-16 DIAGNOSIS — R6889 Other general symptoms and signs: Secondary | ICD-10-CM

## 2022-11-16 DIAGNOSIS — M549 Dorsalgia, unspecified: Secondary | ICD-10-CM
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
## 2022-11-18 ENCOUNTER — Ambulatory Visit: Payer: No Typology Code available for payment source

## 2022-11-18 ENCOUNTER — Other Ambulatory Visit: Payer: Self-pay

## 2022-11-18 DIAGNOSIS — M549 Dorsalgia, unspecified: Secondary | ICD-10-CM

## 2022-11-18 DIAGNOSIS — R6889 Other general symptoms and signs: Secondary | ICD-10-CM

## 2022-11-18 DIAGNOSIS — M2569 Stiffness of other specified joint, not elsewhere classified: Secondary | ICD-10-CM

## 2022-11-18 NOTE — Therapy (Signed)
OUTPATIENT PHYSICAL THERAPY THORACOLUMBAR PROGRESS REPORT  Progress Note Reporting Period 10/05/22 to 11/18/22  See note below for Objective Data and Assessment of Progress/Goals.     Patient Name: Michael Carter MRN: [id number] DOB:25-Aug-1985, 37 y.o., male Today's Date: 11/18/2022  END OF SESSION:  PT End of Session - 11/18/22 0851     Visit Number 13    Date for PT Re-Evaluation 11/30/22    Authorization Type [facility]    Authorization - Number of Visits 15    PT Start Time [phone]    PT Stop Time 0930    PT Time Calculation (min) 41 min    Activity Tolerance Patient tolerated treatment well    Behavior During Therapy WFL for tasks assessed/performed                Past Medical History:  Diagnosis Date   Acid reflux    Asthma    Chronic pain    CKD (chronic kidney disease)    DDD (degenerative disc disease), lumbar    Headache    Liver disease    OSA (obstructive sleep apnea)    Plantar fasciitis    Vitamin D deficiency    Past Surgical History:  Procedure Laterality Date   NO PAST SURGERIES     Patient Active Problem List   Diagnosis Date Noted   Migraine without aura and without status migrainosus, not intractable 04/01/2020    PCP: Knox Royalty  REFERRING PROVIDER: Su Hoff  REFERRING DIAG: myofscial pain syndrome, midline back pain  Rationale for Evaluation and Treatment: Rehabilitation  THERAPY DIAG:  Mid back pain  Back stiffness  Impaired tolerance of activity  ONSET DATE: 3 months ago  SUBJECTIVE:                                                                                                                                                                                           SUBJECTIVE STATEMENT:  had an accident at work a couple of weeks ago and had a concussion so had headaches. Pool helped with lower back. Pt reports he no longer has light sensitivity or "brain throbbing", and headaches have lessened.   He plans to check into [facility]  after 1st of month to find out what his insurance will pay for.     PERTINENT HISTORY:  December was hurt at work when shirt was caught in conveyer belt, which pulled him in partially and he had to forcibly push himself out of the machine backwards.  MD has recommended injections.  B knee pain chronic. States that referring MD wants him to participate in aquatic PT.  PAIN:  Are you having pain? Yes: NPRS scale: 3-4/10 lower back; 6-7/10 Left side of neck  Pain location: see above  Pain description: achy Aggravating factors: certain reaching activities sometimes when reaching Relieving factors: pain lasts several hours when it occurs.  Takes tramadol with flare ups  PRECAUTIONS: None  WEIGHT BEARING RESTRICTIONS: No  FALLS:  Has patient fallen in last 6 months? No  LIVING ENVIRONMENT: Lives with: lives with their family, lives with their partner, and lives with their son Lives in: House/apartment Stairs: Yes: Internal: 4 steps; can reach both Has following equipment at home: None  OCCUPATION: runs a roll foil machine, lots of standing and walking, 8 hr shifts ,3rd  PLOF: Independent  PATIENT GOALS: get rid of pain back , and be able to lift, move better   NEXT MD VISIT: 11/19/22  OBJECTIVE:   DIAGNOSTIC FINDINGS:  Had LS x rays and MRI but not available today  PATIENT SURVEYS:  Modified Oswestry 20/50 or 40%   SCREENING FOR RED FLAGS: Bowel or bladder incontinence: No Spinal tumors: No Cauda equina syndrome: No Compression fracture: No Abdominal aneurysm: No  COGNITION: Overall cognitive status: Within functional limits for tasks assessed     SENSATION: Reports B hand numbness with prolonged shoulder elevation/ I.e., driving  MUSCLE LENGTH: Hamstrings: Right -40 deg; Left -40 de  POSTURE: L iliac crest high, L shoulder low, flattened LS spine, loss of gluteal mass,  L side bending with forward flexion standing  PALPATION: PA glides thoraco lumbar junciton  and post ribs 6 to 9 B tender  LUMBAR ROM:   AROM eval 11/12/22 11/18/22  Flexion 50% 95% fingertips to ankles 90% to ankles again  Extension wnl  wnl  Right lateral flexion 75% WNL, with pain Wnl. "Sore"  Left lateral flexion 50% WNL, with pain Wnl " sore"  Right rotation     Left rotation        LOWER EXTREMITY ROM:   all LE ROM WNL    LOWER EXTREMITY MMT:  all MMT LE's wfl B  LUMBAR SPECIAL TESTS:  Straight leg raise test: Negative and FABER test: Negative  GAIT: Distance walked: over 100' in the clinic without deficits   TODAY'S TREATMENT:                                                                                                                              DATE:  11/18/22:   Reassessed for progress Manual: HVLA PA at cervicothoracic jxn R and L HVLA T 6  Trigger Point Dry-Needling  Treatment instructions: Expect mild to moderate muscle soreness. S/S of pneumothorax if dry needled over a lung field, and to seek immediate medical attention should they occur. Patient verbalized understanding of these instructions and education. Patient Consent Given: Yes Education handout provided: Yes Muscles treated: B upper traps, multiple pts  Electrical stimulation performed: No Parameters: N/A Treatment response/outcome: Twitch Response Elicited and Palpable Increase in Muscle Length  Therapeutic  exercise:  Lat pulls, 45#, 2 sets 15 reps Nustep level 5 Ue's and LE's 6 min  Leg press, B LE's, 50#, 2 sets 15   11/16/22 Pt seen for aquatic therapy today.  Treatment took place in water 3.5-4.75 ft in depth at the [facility] pool. Temp of water was 91.  Pt entered/exited the pool via stairs using step through pattern  with hand rail.  *unsupported: walking Forward/ backward with cues for reciprocal arm swing;  * side stepping with arm addct/abdct ->with yellow and blue hand floats * bow and arrow with step back alternating sides, with blue hand floats * Hip hinge with  forward arm reach holding blue hand floats x 5 * staggered stance with blue hand floats:  tricep press downs x 10; bilat shoulder horiz abdct/addct  2 x 5 reps each  * marching with same side and opp side knee taps with blue/ rainbow hand floats * walking forward/backward with yellow hand floats under water with TrA set * TrA set with yellow hand float pull down x 10 with isometric holds on the return to surface * Stradding yellow noodle and holding yellow hand floats, cycling in deeper water * staggered stance with kick board row * Back against wall, with solid noodle under ankle:  hamstring/ ITB/ adductor stretch x 15s x 2 reps each LE * quad stretch with ankle on noodle * at stairs - fig 4 stretch x 20s each LE  11/12/22 SELF CARE- prior to entry in water, discussed use of TENS unit with/without heat (rationale/parameters), importance of upright posture and maintaining cervical/lumbar curves with neutral head (vs lateral Rt flexion)  Pt seen for aquatic therapy today.  Treatment took place in water 3.5-4.75 ft in depth at the [facility] pool. Temp of water was 91.  Pt entered/exited the pool via stairs using step through pattern  with hand rail.  *unsupported: walking Forward/ backward with cues for reciprocal arm swing;  * side stepping with arm addct/abdct ->with rainbow and yellow hand floats * staggered stance with yellow ->blue hand floats:  tricep press downs 2 x 5; bilat shoulder horiz abdct/addct  2 x 5 reps each  * bow and arrow with step back 5 reps each side, 2 sets with yellow hand floats * TrA set with solid noodle pull down x 5 -> yellow noodle x 5 with isometric holds on the return to surface * quad stretch with foot supported by solid noodle at ankle x15s x 2 with forward lean/straight leg in between reps * reciprocal arm swing with medium bells in standard stance, then with 2 laps of forward gait * Back against wall, with solid noodle under ankle:  hamstring/ ITB/  adductor stretch x 15s x 3 reps each LE * at stairs - fig 4 stretch x 20s each LE  11/09/22 Pt seen for aquatic therapy today.  Treatment took place in water 3.5-4.75 ft in depth at the [facility] pool. Temp of water was 91.  Pt entered/exited the pool via stairs using step through pattern  with hand rail.  *unsupported: walking Forward/ backward and side stepping with arm addct/abdct ->with rainbow and yellow hand floats * staggered stance with yellow hand floats:  tricep press downs; bilat shoulder horiz abdct/addct  ~8-10 reps each  * bow and arrow with step back 5 reps each side, 2 sets  * TrA set with solid noodle pull down x 15 * shoulder rolls, lateral neck flexion stretches * Back against wall,  with solid noodle under ankle:  hamstring/ ITB/ adductor stretch x 15s x 2 reps each LE * quad stretch with foot supported by solid noodle at ankle x15s x 2 with forward lean/straight leg in between reps * at stairs - fig 4 stretch x 20s each LE   11/05/22 Patient was hit in the head yesterday and saw ED, has a concussion, MD was to not exert the next few days. DN to the upper traps. STM to the above Education on the concussion and the need to let the brain rest and recover MHP/IFC to the upper traps and cervical area  11/03/22 Pt seen for aquatic therapy today.  Treatment took place in water 3.5-4.75 ft in depth at the [facility] pool. Temp of water was 91.  Pt entered/exited the pool via stairs using step through pattern  with hand rail.  *intro to setting *Forward, backward and side stepping *L stretch; then with tail wagging *Using yellow hand buoys: Shoulder horizontal add/abd; resisted shoulder depression; bow and arrow R/L x 10-12 *lumbar rotation *Leaning against wall using solid noodle: hamstring, gastroc, adductor; SKTC *Core engagement TrA isometric: Yellow noodle pull down in staggered stance then wide stance x 10.  VC and demonstration for proper  execution *hip hinging. VC and demonstration for execution  Pt requires the buoyancy and hydrostatic pressure of water for support, and to offload joints by unweighting joint load by at least 50 % in navel deep water and by at least 75-80% in chest to neck deep water.  Viscosity of the water is needed for resistance of strengthening. Water current perturbations provides challenge to standing balance requiring increased core activation.    10/28/22 NuStep L5 x12mins Calf stretch 30s Seated rows and lats 45# 2x10 blackTB ext 2x10  Supine trunk rotations Stretching HS 30s both sides  Pball forward flexion x10  STM with massage gun to upper traps, and low back  Shoulder ext 10# 2x10   10/26/22 Nustep level 5 x 5 minutes Feet on ball K2C, rotation, small bridge, iso abs Passive HS and piriformis stretches DN to the bilateral upper traps with good LTR's STM with the theragun to the upper traps the neck and the rhomboids Thoracic segmental mobility PA and rotational glides Education on some posture and body mechanics for his job, education on POC and his dx  10/19/22: Manual:  HVLA C6/7 Supine contract/relax upper cervical spine for R rotation with improvement noted  Therapeutic exercise:   Seated rows 45#, 2 sets 20x B LE leg press, 40#, 15 x 3 Black metal bar for B calf stretch and heel raises. Seated stretch with blue ball, forward B shoulder flexion , thoracic extension stretch.  10/14/22: Therapeutic exercise: Nustep level 6, 5 min, Ue's and LE's  lat rows 45#, 2 sets 15 reps  B shoulder rows 45# 2 sets 15 reps  Nustep level 5, UE's and LE's , 7 min for tissue perfusion, endurance , recumbent to avoid compressive forces B knees   Leg press, 40# 3 sets 15, advised to press more through heels to isolate post musculature , gluts  Seated large blue therapeutic ball for stretching for/ side/ rotation Supine large ball B knee to chest, LTR   10/12/22: Therapeutic  exercise: Seated for lumbar and thoracic stretch with 65cm ball, 5 reps, 10 sec holds, forward Supine for B knee to chest stretch, feet on ball Supine for B LTR feet on ball  L side lying for one bout of muscle energy technique  to address restricted L lumbar flexion, rot, side bending 5 sec bouts  Nustep level 4 Ue's and LE's 5 min  Seated lat rows 45#, 2 sets 15 reps  B shoulder rows 45# 2 sets 15 reps  Supine thoracic spine stretch over 1/2 roll, roll parallel to spine for pecs and postural stretch 10/07/22: Manual:  prone for PA HVLA mobs mid and lower thoracic spine PA glides B post ribs mid and lower L side lying for muscle energy to isolate flex, rot. SB stretch lower L lumbar spine one set 8 reps, 5 sec holds  Therex:  lat rows 45#, 2 sets 15 reps  B shoulder rows 45# 2 sets 15 reps Supine with 65 cm ball under legs, for B knee to chest, LTR No new ex added for home.  10/05/22:  Manual:  L side lying for muscle energy to improve L lumbar flexion/rot/SB restriction, 5 reps, 5 sec holds.  Reassessed LS forward bending in standing immediately afterward and improved motion noted  Therex: instructed in standing lumbar flexion R rotation and R side bending stretch with R hands on R lateral lower leg, and R foot on 4" step stool, to gap L lumbar spine  Also standing in door way for pec stretch, cues to maintain shoulders at 90 degrees abd.     PATIENT EDUCATION:  Education details: aquatic exercise rationale and modifications -aquatic HEP issued Person educated: Patient Education method: Explanation, Demonstration, Tactile cues, and Verbal cues  Education comprehension: verbalized understanding  HOME EXERCISE PROGRAM: AQUATIC Access Code: [email or web address] URL: [email or web address] Date: 11/12/2022 Prepared by: [facility] This aquatic home exercise program from [employer] utilizes pictures from land based exercises, but has been adapted  prior to lamination and issuance.     ASSESSMENT:  CLINICAL IMPRESSION:  He tolerated all aquatic exercises well, with reduction of back pain.  Neck pain remained unchanged. He was able to progress exercises with increased resistance without any issues.  Issued laminated aquatic HEP at end of session.  Pt to continue HEP at local pool independently and remaining PT visits will be land based.  Progressing towards LTGs.    OBJECTIVE IMPAIRMENTS: decreased activity tolerance, decreased mobility, difficulty walking, decreased ROM, postural dysfunction, and pain.   ACTIVITY LIMITATIONS: lifting, bending, sitting, and locomotion level  PARTICIPATION LIMITATIONS: driving, community activity, and yard work  PERSONAL FACTORS: Fitness and 1-2 comorbidities: chronic DJD knees, obesity  are also affecting patient's functional outcome.   REHAB POTENTIAL: Good  CLINICAL DECISION MAKING: Stable/uncomplicated  EVALUATION COMPLEXITY: Low   GOALS: Goals reviewed with patient? Yes  SHORT TERM GOALS: Target date: 2 weeks 10/19/22  I HEP Baseline:initiated at eval Goal status: met 10/26/22  LONG TERM GOALS: Target date: 11/30/22  Oswestry improve to 30% disability Baseline: 40% Goal status: IN PROGRESS oswestry needs assessment within next 2 visits  2.  Improve lumbar ROM for forward bending to wnl without pain provocation spine Baseline: 95% without pain Goal status: PARTIALLY MET - 11/12/22 11/18/22:  partially met   3.  Improve Sx B Ue's, reduce frequency of B UE paresthesia to 0% Baseline: occurs with driving, activities using B hands Goal status: IN PROGRESS 11/18/22:  with driving a long distances, over 30 min, experiences the numbness and tingling B hands.    PLAN:  PT FREQUENCY: 1-2x/week  PT DURATION: 8 weeks  PLANNED INTERVENTIONS: Therapeutic exercises, Therapeutic activity, Neuromuscular re-education, Balance training, Gait training, Patient/Family education, Self Care, and  Joint mobilization.  PLAN FOR NEXT SESSION: additional therx, mobilization lumbar and thoracic , on land:  see how the DN did and see if we want to try traction.    11/18/22 12:49 PM [facility] [address] [place], [place], [address] Phone: [phone]   Fax:  [phone]

## 2022-12-02 ENCOUNTER — Ambulatory Visit: Payer: No Typology Code available for payment source | Attending: Chiropractic Medicine | Admitting: Physical Therapy

## 2022-12-02 ENCOUNTER — Encounter: Payer: Self-pay | Admitting: Physical Therapy

## 2022-12-02 DIAGNOSIS — M549 Dorsalgia, unspecified: Secondary | ICD-10-CM | POA: Insufficient documentation

## 2022-12-02 DIAGNOSIS — R6889 Other general symptoms and signs: Secondary | ICD-10-CM | POA: Insufficient documentation

## 2022-12-02 DIAGNOSIS — M2569 Stiffness of other specified joint, not elsewhere classified: Secondary | ICD-10-CM | POA: Diagnosis present

## 2022-12-02 NOTE — Therapy (Signed)
OUTPATIENT PHYSICAL THERAPY THORACOLUMBAR PROGRESS REPORT  Progress Note Reporting Period 10/05/22 to 11/18/22  See note below for Objective Data and Assessment of Progress/Goals.     Patient Name: Michael Carter MRN: [id number] DOB:[birth year], 37 y.o., male Today's Date: 12/02/2022  END OF SESSION:  PT End of Session - 12/02/22 0801     Visit Number 14    Date for PT Re-Evaluation 11/30/22    Authorization Type [facility]    PT Start Time 0800    PT Stop Time 0844    PT Time Calculation (min) 44 min    Activity Tolerance Patient tolerated treatment well    Behavior During Therapy WFL for tasks assessed/performed                Past Medical History:  Diagnosis Date   Acid reflux    Asthma    Chronic pain    CKD (chronic kidney disease)    DDD (degenerative disc disease), lumbar    Headache    Liver disease    OSA (obstructive sleep apnea)    Plantar fasciitis    Vitamin D deficiency    Past Surgical History:  Procedure Laterality Date   NO PAST SURGERIES     Patient Active Problem List   Diagnosis Date Noted   Migraine without aura and without status migrainosus, not intractable 04/01/2020    PCP: Knox Royalty  REFERRING PROVIDER: Su Hoff  REFERRING DIAG: myofscial pain syndrome, midline back pain  Rationale for Evaluation and Treatment: Rehabilitation  THERAPY DIAG:  Mid back pain  Back stiffness  Impaired tolerance of activity  ONSET DATE: 3 months ago  SUBJECTIVE:                                                                                                                                                                                           SUBJECTIVE STATEMENT:  had an accident at work a couple of weeks ago and had a concussion so had headaches. Pool helped with lower back. [facility]  He just got off work, we discussed that [facility] in the next few weeks.     PERTINENT HISTORY:   December was hurt at work when shirt was caught in conveyer belt, which pulled him in partially and he had to forcibly push himself out of the machine backwards.  MD has recommended injections.  B knee pain chronic. States that referring MD wants him to participate in aquatic PT.  PAIN:  Are you having pain? Yes: NPRS scale: 4/10 lower back; 6/10 Left side  of neck  Pain location: see above  Pain description: achy Aggravating factors: certain reaching activities sometimes when reaching Relieving factors: pain lasts several hours when it occurs.  Takes tramadol with flare ups  PRECAUTIONS: None  WEIGHT BEARING RESTRICTIONS: No  FALLS:  Has patient fallen in last 6 months? No  LIVING ENVIRONMENT: Lives with: lives with their family, lives with their partner, and lives with their son Lives in: House/apartment Stairs: Yes: Internal: 4 steps; can reach both Has following equipment at home: None  OCCUPATION: runs a roll foil machine, lots of standing and walking, 8 hr shifts ,3rd  PLOF: Independent  PATIENT GOALS: get rid of pain back , and be able to lift, move better   NEXT MD VISIT: 11/19/22  OBJECTIVE:   DIAGNOSTIC FINDINGS:  Had LS x rays and MRI but not available today  PATIENT SURVEYS:  Modified Oswestry 20/50 or 40%   SCREENING FOR RED FLAGS: Bowel or bladder incontinence: No Spinal tumors: No Cauda equina syndrome: No Compression fracture: No Abdominal aneurysm: No  COGNITION: Overall cognitive status: Within functional limits for tasks assessed     SENSATION: Reports B hand numbness with prolonged shoulder elevation/ I.e., driving  MUSCLE LENGTH: Hamstrings: Right -40 deg; Left -40 de  POSTURE: L iliac crest high, L shoulder low, flattened LS spine, loss of gluteal mass,  L side bending with forward flexion standing  PALPATION: PA glides thoraco lumbar junciton and post ribs 6 to 9 B tender  LUMBAR ROM:   AROM eval 11/12/22 11/18/22  Flexion 50% 95%  fingertips to ankles 90% to ankles again  Extension wnl  wnl  Right lateral flexion 75% WNL, with pain Wnl. "Sore"  Left lateral flexion 50% WNL, with pain Wnl " sore"  Right rotation     Left rotation        LOWER EXTREMITY ROM:   all LE ROM WNL    LOWER EXTREMITY MMT:  all MMT LE's wfl B  LUMBAR SPECIAL TESTS:  Straight leg raise test: Negative and FABER test: Negative  GAIT: Distance walked: over 100' in the clinic without deficits   TODAY'S TREATMENT:                                                                                                                              DATE:  12/02/22 DN to the upper traps and neck Thoracic PA pressures, Rotational glides thoracic Cervical traction with saunders unit 15# x 10 minutes STM with the theragun to the upper traps and the cervical area and into the rhomboids Discussion about pool and exercises and stretches at home and the [place] referral and continuation process  11/18/22:   Reassessed for progress Manual: HVLA PA at cervicothoracic jxn R and L HVLA T 6  Trigger Point Dry-Needling  Treatment instructions: Expect mild to moderate muscle soreness. S/S of pneumothorax if dry needled over a lung field, and to seek immediate medical attention should they occur.  Patient verbalized understanding of these instructions and education. Patient Consent Given: Yes Education handout provided: Yes Muscles treated: B upper traps, multiple pts  Electrical stimulation performed: No Parameters: N/A Treatment response/outcome: Twitch Response Elicited and Palpable Increase in Muscle Length  Therapeutic exercise:  Lat pulls, 45#, 2 sets 15 reps Nustep level 5 Ue's and LE's 6 min  Leg press, B LE's, 50#, 2 sets 15   11/16/22 Pt seen for aquatic therapy today.  Treatment took place in water 3.5-4.75 ft in depth at the [facility] pool. Temp of water was 91.  Pt entered/exited the pool via stairs using step through pattern  with hand  rail.  *unsupported: walking Forward/ backward with cues for reciprocal arm swing;  * side stepping with arm addct/abdct ->with yellow and blue hand floats * bow and arrow with step back alternating sides, with blue hand floats * Hip hinge with forward arm reach holding blue hand floats x 5 * staggered stance with blue hand floats:  tricep press downs x 10; bilat shoulder horiz abdct/addct  2 x 5 reps each  * marching with same side and opp side knee taps with blue/ rainbow hand floats * walking forward/backward with yellow hand floats under water with TrA set * TrA set with yellow hand float pull down x 10 with isometric holds on the return to surface * Stradding yellow noodle and holding yellow hand floats, cycling in deeper water * staggered stance with kick board row * Back against wall, with solid noodle under ankle:  hamstring/ ITB/ adductor stretch x 15s x 2 reps each LE * quad stretch with ankle on noodle * at stairs - fig 4 stretch x 20s each LE  11/12/22 SELF CARE- prior to entry in water, discussed use of TENS unit with/without heat (rationale/parameters), importance of upright posture and maintaining cervical/lumbar curves with neutral head (vs lateral Rt flexion)  Pt seen for aquatic therapy today.  Treatment took place in water 3.5-4.75 ft in depth at the [facility] pool. Temp of water was 91.  Pt entered/exited the pool via stairs using step through pattern  with hand rail.  *unsupported: walking Forward/ backward with cues for reciprocal arm swing;  * side stepping with arm addct/abdct ->with rainbow and yellow hand floats * staggered stance with yellow ->blue hand floats:  tricep press downs 2 x 5; bilat shoulder horiz abdct/addct  2 x 5 reps each  * bow and arrow with step back 5 reps each side, 2 sets with yellow hand floats * TrA set with solid noodle pull down x 5 -> yellow noodle x 5 with isometric holds on the return to surface * quad stretch with foot  supported by solid noodle at ankle x15s x 2 with forward lean/straight leg in between reps * reciprocal arm swing with medium bells in standard stance, then with 2 laps of forward gait * Back against wall, with solid noodle under ankle:  hamstring/ ITB/ adductor stretch x 15s x 3 reps each LE * at stairs - fig 4 stretch x 20s each LE  11/09/22 Pt seen for aquatic therapy today.  Treatment took place in water 3.5-4.75 ft in depth at the [facility] pool. Temp of water was 91.  Pt entered/exited the pool via stairs using step through pattern  with hand rail.  *unsupported: walking Forward/ backward and side stepping with arm addct/abdct ->with rainbow and yellow hand floats * staggered stance with yellow hand floats:  tricep press downs; bilat  shoulder horiz abdct/addct  ~8-10 reps each  * bow and arrow with step back 5 reps each side, 2 sets  * TrA set with solid noodle pull down x 15 * shoulder rolls, lateral neck flexion stretches * Back against wall, with solid noodle under ankle:  hamstring/ ITB/ adductor stretch x 15s x 2 reps each LE * quad stretch with foot supported by solid noodle at ankle x15s x 2 with forward lean/straight leg in between reps * at stairs - fig 4 stretch x 20s each LE   11/05/22 Patient was hit in the head yesterday and saw ED, has a concussion, MD was to not exert the next few days. DN to the upper traps. STM to the above Education on the concussion and the need to let the brain rest and recover MHP/IFC to the upper traps and cervical area  11/03/22 Pt seen for aquatic therapy today.  Treatment took place in water 3.5-4.75 ft in depth at the [facility] pool. Temp of water was 91.  Pt entered/exited the pool via stairs using step through pattern  with hand rail.  *intro to setting *Forward, backward and side stepping *L stretch; then with tail wagging *Using yellow hand buoys: Shoulder horizontal add/abd; resisted shoulder depression; bow and  arrow R/L x 10-12 *lumbar rotation *Leaning against wall using solid noodle: hamstring, gastroc, adductor; SKTC *Core engagement TrA isometric: Yellow noodle pull down in staggered stance then wide stance x 10.  VC and demonstration for proper execution *hip hinging. VC and demonstration for execution  Pt requires the buoyancy and hydrostatic pressure of water for support, and to offload joints by unweighting joint load by at least 50 % in navel deep water and by at least 75-80% in chest to neck deep water.  Viscosity of the water is needed for resistance of strengthening. Water current perturbations provides challenge to standing balance requiring increased core activation.    10/28/22 NuStep L5 x69mins Calf stretch 30s Seated rows and lats 45# 2x10 blackTB ext 2x10  Supine trunk rotations Stretching HS 30s both sides  Pball forward flexion x10  STM with massage gun to upper traps, and low back  Shoulder ext 10# 2x10   10/26/22 Nustep level 5 x 5 minutes Feet on ball K2C, rotation, small bridge, iso abs Passive HS and piriformis stretches DN to the bilateral upper traps with good LTR's STM with the theragun to the upper traps the neck and the rhomboids Thoracic segmental mobility PA and rotational glides Education on some posture and body mechanics for his job, education on POC and his dx  10/19/22: Manual:  HVLA C6/7 Supine contract/relax upper cervical spine for R rotation with improvement noted  Therapeutic exercise:   Seated rows 45#, 2 sets 20x B LE leg press, 40#, 15 x 3 Black metal bar for B calf stretch and heel raises. Seated stretch with blue ball, forward B shoulder flexion , thoracic extension stretch.  10/14/22: Therapeutic exercise: Nustep level 6, 5 min, Ue's and LE's  lat rows 45#, 2 sets 15 reps  B shoulder rows 45# 2 sets 15 reps  Nustep level 5, UE's and LE's , 7 min for tissue perfusion, endurance , recumbent to avoid compressive forces B knees   Leg  press, 40# 3 sets 15, advised to press more through heels to isolate post musculature , gluts  Seated large blue therapeutic ball for stretching for/ side/ rotation Supine large ball B knee to chest, LTR   10/12/22: Therapeutic exercise:  Seated for lumbar and thoracic stretch with 65cm ball, 5 reps, 10 sec holds, forward Supine for B knee to chest stretch, feet on ball Supine for B LTR feet on ball  L side lying for one bout of muscle energy technique to address restricted L lumbar flexion, rot, side bending 5 sec bouts  Nustep level 4 Ue's and LE's 5 min  Seated lat rows 45#, 2 sets 15 reps  B shoulder rows 45# 2 sets 15 reps  Supine thoracic spine stretch over 1/2 roll, roll parallel to spine for pecs and postural stretch 10/07/22: Manual:  prone for PA HVLA mobs mid and lower thoracic spine PA glides B post ribs mid and lower L side lying for muscle energy to isolate flex, rot. SB stretch lower L lumbar spine one set 8 reps, 5 sec holds  Therex:  lat rows 45#, 2 sets 15 reps  B shoulder rows 45# 2 sets 15 reps Supine with 65 cm ball under legs, for B knee to chest, LTR No new ex added for home.  10/05/22:  Manual:  L side lying for muscle energy to improve L lumbar flexion/rot/SB restriction, 5 reps, 5 sec holds.  Reassessed LS forward bending in standing immediately afterward and improved motion noted  Therex: instructed in standing lumbar flexion R rotation and R side bending stretch with R hands on R lateral lower leg, and R foot on 4" step stool, to gap L lumbar spine  Also standing in door way for pec stretch, cues to maintain shoulders at 90 degrees abd.     PATIENT EDUCATION:  Education details: aquatic exercise rationale and modifications -aquatic HEP issued Person educated: Patient Education method: Explanation, Demonstration, Tactile cues, and Verbal cues  Education comprehension: verbalized understanding  HOME EXERCISE PROGRAM: AQUATIC Access Code:  [email or web address] URL: [email or web address] Date: 11/12/2022 Prepared by: [facility] This aquatic home exercise program from [employer] utilizes pictures from land based exercises, but has been adapted prior to lamination and issuance.     ASSESSMENT:  CLINICAL IMPRESSION: Patient is going to get a knee brace today or tomorrow from the [place], he saw a pain management MD at the [place] last week.  He does discuss looking at pool options closer to where he lives, he also talks about a referral to [place] [facility] in the future and an epidural injection in the neck in the future.  We discussed that the next visit here will probably be his last with [place] unless we get another referral.  I did try cervical traction today   OBJECTIVE IMPAIRMENTS: decreased activity tolerance, decreased mobility, difficulty walking, decreased ROM, postural dysfunction, and pain.   ACTIVITY LIMITATIONS: lifting, bending, sitting, and locomotion level  PARTICIPATION LIMITATIONS: driving, community activity, and yard work  PERSONAL FACTORS: Fitness and 1-2 comorbidities: chronic DJD knees, obesity  are also affecting patient's functional outcome.   REHAB POTENTIAL: Good  CLINICAL DECISION MAKING: Stable/uncomplicated  EVALUATION COMPLEXITY: Low   GOALS: Goals reviewed with patient? Yes  SHORT TERM GOALS: Target date: 2 weeks 10/19/22  I HEP Baseline:initiated at eval Goal status: met 10/26/22  LONG TERM GOALS: Target date: 11/30/22  Oswestry improve to 30% disability Baseline: 40% Goal status: 12/02/22 22% met  2.  Improve lumbar ROM for forward bending to wnl without pain provocation spine Baseline: 95% without pain Goal status: PARTIALLY MET - 11/12/22 11/18/22:  partially met   3.  Improve Sx B Ue's, reduce frequency of B UE paresthesia to 0%  Baseline: occurs with driving, activities using B hands Goal status: IN PROGRESS 11/18/22:  with driving a long distances, over 30 min,  experiences the numbness and tingling B hands.    PLAN:  PT FREQUENCY: 1-2x/week  PT DURATION: 8 weeks  PLANNED INTERVENTIONS: Therapeutic exercises, Therapeutic activity, Neuromuscular re-education, Balance training, Gait training, Patient/Family education, Self Care, and Joint mobilization.  PLAN FOR NEXT SESSION: asee how traction did, feel like next visit will be his last with [place] as he has other referrals that he is going to do with the [place], assure HEP  Stacie Glaze, PT 12/02/22 8:02 AM [address] [place], [place], [address] Phone: [phone]   Fax:  [phone]
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
## 2022-12-04 ENCOUNTER — Ambulatory Visit: Payer: No Typology Code available for payment source

## 2022-12-04 DIAGNOSIS — M2569 Stiffness of other specified joint, not elsewhere classified: Secondary | ICD-10-CM

## 2022-12-04 DIAGNOSIS — M549 Dorsalgia, unspecified: Secondary | ICD-10-CM | POA: Diagnosis not present

## 2022-12-04 DIAGNOSIS — R6889 Other general symptoms and signs: Secondary | ICD-10-CM

## 2023-02-10 ENCOUNTER — Encounter: Payer: Self-pay | Admitting: Internal Medicine
